# Patient Record
Sex: Female | Born: 1975 | Race: Black or African American | Hispanic: No | Marital: Single | Smoking: Current every day smoker
Health system: Southern US, Community
[De-identification: ages and names within clinical notes are randomized; demographics above are authoritative.]

## PROBLEM LIST (undated history)

## (undated) ENCOUNTER — Inpatient Hospital Stay (HOSPITAL_COMMUNITY): Payer: Self-pay

## (undated) DIAGNOSIS — E785 Hyperlipidemia, unspecified: Secondary | ICD-10-CM

## (undated) DIAGNOSIS — D219 Benign neoplasm of connective and other soft tissue, unspecified: Secondary | ICD-10-CM

## (undated) DIAGNOSIS — N730 Acute parametritis and pelvic cellulitis: Secondary | ICD-10-CM

## (undated) DIAGNOSIS — M6530 Trigger finger, unspecified finger: Secondary | ICD-10-CM

## (undated) DIAGNOSIS — D649 Anemia, unspecified: Secondary | ICD-10-CM

## (undated) DIAGNOSIS — R87629 Unspecified abnormal cytological findings in specimens from vagina: Secondary | ICD-10-CM

## (undated) DIAGNOSIS — B977 Papillomavirus as the cause of diseases classified elsewhere: Secondary | ICD-10-CM

## (undated) DIAGNOSIS — K219 Gastro-esophageal reflux disease without esophagitis: Secondary | ICD-10-CM

## (undated) DIAGNOSIS — F419 Anxiety disorder, unspecified: Secondary | ICD-10-CM

## (undated) DIAGNOSIS — F32A Depression, unspecified: Secondary | ICD-10-CM

## (undated) DIAGNOSIS — E119 Type 2 diabetes mellitus without complications: Secondary | ICD-10-CM

## (undated) HISTORY — PX: OTHER SURGICAL HISTORY: SHX169

## (undated) HISTORY — DX: Anxiety disorder, unspecified: F41.9

## (undated) HISTORY — DX: Gastro-esophageal reflux disease without esophagitis: K21.9

## (undated) HISTORY — PX: COLPOSCOPY W/ BIOPSY / CURETTAGE: SUR283

## (undated) HISTORY — PX: TUBAL LIGATION: SHX77

## (undated) HISTORY — DX: Depression, unspecified: F32.A

## (undated) HISTORY — DX: Anemia, unspecified: D64.9

## (undated) HISTORY — DX: Hyperlipidemia, unspecified: E78.5

## (undated) HISTORY — PX: DILATION AND CURETTAGE OF UTERUS: SHX78

## (undated) HISTORY — PX: ROTATOR CUFF REPAIR: SHX139

## (undated) SURGERY — Surgical Case
Anesthesia: *Unknown

---

## 1998-01-25 ENCOUNTER — Encounter: Admission: RE | Admit: 1998-01-25 | Discharge: 1998-01-25 | Payer: Self-pay | Admitting: Obstetrics & Gynecology

## 1998-04-29 ENCOUNTER — Emergency Department (HOSPITAL_COMMUNITY): Admission: EM | Admit: 1998-04-29 | Discharge: 1998-04-29 | Payer: Self-pay | Admitting: Emergency Medicine

## 1998-12-01 ENCOUNTER — Encounter: Admission: RE | Admit: 1998-12-01 | Discharge: 1998-12-01 | Payer: Self-pay | Admitting: Internal Medicine

## 1998-12-28 ENCOUNTER — Encounter: Admission: RE | Admit: 1998-12-28 | Discharge: 1998-12-28 | Payer: Self-pay | Admitting: Internal Medicine

## 1999-04-03 ENCOUNTER — Encounter: Admission: RE | Admit: 1999-04-03 | Discharge: 1999-04-03 | Payer: Self-pay | Admitting: Internal Medicine

## 1999-07-28 ENCOUNTER — Encounter: Admission: RE | Admit: 1999-07-28 | Discharge: 1999-07-28 | Payer: Self-pay | Admitting: Obstetrics & Gynecology

## 1999-08-14 ENCOUNTER — Encounter: Admission: RE | Admit: 1999-08-14 | Discharge: 1999-08-14 | Payer: Self-pay | Admitting: Internal Medicine

## 1999-08-14 ENCOUNTER — Ambulatory Visit (HOSPITAL_COMMUNITY): Admission: RE | Admit: 1999-08-14 | Discharge: 1999-08-14 | Payer: Self-pay | Admitting: Internal Medicine

## 1999-08-14 ENCOUNTER — Emergency Department (HOSPITAL_COMMUNITY): Admission: EM | Admit: 1999-08-14 | Discharge: 1999-08-14 | Payer: Self-pay | Admitting: Emergency Medicine

## 1999-08-16 ENCOUNTER — Encounter: Admission: RE | Admit: 1999-08-16 | Discharge: 1999-08-16 | Payer: Self-pay | Admitting: Internal Medicine

## 1999-08-21 ENCOUNTER — Encounter: Admission: RE | Admit: 1999-08-21 | Discharge: 1999-08-21 | Payer: Self-pay | Admitting: Internal Medicine

## 1999-10-16 ENCOUNTER — Other Ambulatory Visit: Admission: RE | Admit: 1999-10-16 | Discharge: 1999-10-16 | Payer: Self-pay | Admitting: Obstetrics and Gynecology

## 1999-10-19 ENCOUNTER — Encounter: Admission: RE | Admit: 1999-10-19 | Discharge: 2000-01-17 | Payer: Self-pay | Admitting: Obstetrics and Gynecology

## 1999-11-14 ENCOUNTER — Inpatient Hospital Stay (HOSPITAL_COMMUNITY): Admission: AD | Admit: 1999-11-14 | Discharge: 1999-11-14 | Payer: Self-pay | Admitting: Obstetrics & Gynecology

## 2000-01-03 ENCOUNTER — Encounter: Payer: Self-pay | Admitting: Obstetrics and Gynecology

## 2000-01-03 ENCOUNTER — Ambulatory Visit (HOSPITAL_COMMUNITY): Admission: RE | Admit: 2000-01-03 | Discharge: 2000-01-03 | Payer: Self-pay | Admitting: Obstetrics and Gynecology

## 2000-01-25 ENCOUNTER — Encounter: Payer: Self-pay | Admitting: Obstetrics and Gynecology

## 2000-01-25 ENCOUNTER — Ambulatory Visit (HOSPITAL_COMMUNITY): Admission: RE | Admit: 2000-01-25 | Discharge: 2000-01-25 | Payer: Self-pay | Admitting: Obstetrics and Gynecology

## 2000-03-04 ENCOUNTER — Inpatient Hospital Stay (HOSPITAL_COMMUNITY): Admission: AD | Admit: 2000-03-04 | Discharge: 2000-03-04 | Payer: Self-pay | Admitting: Obstetrics & Gynecology

## 2000-03-29 ENCOUNTER — Inpatient Hospital Stay (HOSPITAL_COMMUNITY): Admission: AD | Admit: 2000-03-29 | Discharge: 2000-03-29 | Payer: Self-pay | Admitting: Obstetrics and Gynecology

## 2000-04-09 ENCOUNTER — Encounter: Payer: Self-pay | Admitting: Obstetrics and Gynecology

## 2000-04-09 ENCOUNTER — Ambulatory Visit (HOSPITAL_COMMUNITY): Admission: RE | Admit: 2000-04-09 | Discharge: 2000-04-09 | Payer: Self-pay | Admitting: Obstetrics and Gynecology

## 2000-04-12 ENCOUNTER — Encounter (HOSPITAL_COMMUNITY): Admission: RE | Admit: 2000-04-12 | Discharge: 2000-04-25 | Payer: Self-pay | Admitting: Obstetrics and Gynecology

## 2000-04-24 ENCOUNTER — Inpatient Hospital Stay (HOSPITAL_COMMUNITY): Admission: AD | Admit: 2000-04-24 | Discharge: 2000-04-26 | Payer: Self-pay | Admitting: Obstetrics and Gynecology

## 2001-03-28 ENCOUNTER — Inpatient Hospital Stay (HOSPITAL_COMMUNITY): Admission: AD | Admit: 2001-03-28 | Discharge: 2001-03-28 | Payer: Self-pay | Admitting: Obstetrics & Gynecology

## 2001-03-28 ENCOUNTER — Encounter: Payer: Self-pay | Admitting: *Deleted

## 2001-03-31 ENCOUNTER — Inpatient Hospital Stay (HOSPITAL_COMMUNITY): Admission: AD | Admit: 2001-03-31 | Discharge: 2001-03-31 | Payer: Self-pay | Admitting: Obstetrics & Gynecology

## 2001-04-03 ENCOUNTER — Inpatient Hospital Stay (HOSPITAL_COMMUNITY): Admission: AD | Admit: 2001-04-03 | Discharge: 2001-04-03 | Payer: Self-pay | Admitting: Obstetrics & Gynecology

## 2001-04-08 ENCOUNTER — Inpatient Hospital Stay (HOSPITAL_COMMUNITY): Admission: AD | Admit: 2001-04-08 | Discharge: 2001-04-08 | Payer: Self-pay | Admitting: *Deleted

## 2001-04-10 ENCOUNTER — Inpatient Hospital Stay (HOSPITAL_COMMUNITY): Admission: AD | Admit: 2001-04-10 | Discharge: 2001-04-10 | Payer: Self-pay | Admitting: Obstetrics & Gynecology

## 2001-08-30 ENCOUNTER — Inpatient Hospital Stay (HOSPITAL_COMMUNITY): Admission: AD | Admit: 2001-08-30 | Discharge: 2001-08-30 | Payer: Self-pay | Admitting: Obstetrics & Gynecology

## 2001-09-02 ENCOUNTER — Inpatient Hospital Stay (HOSPITAL_COMMUNITY): Admission: AD | Admit: 2001-09-02 | Discharge: 2001-09-02 | Payer: Self-pay | Admitting: *Deleted

## 2001-09-11 ENCOUNTER — Inpatient Hospital Stay (HOSPITAL_COMMUNITY): Admission: AD | Admit: 2001-09-11 | Discharge: 2001-09-11 | Payer: Self-pay | Admitting: Obstetrics & Gynecology

## 2001-09-30 ENCOUNTER — Emergency Department (HOSPITAL_COMMUNITY): Admission: EM | Admit: 2001-09-30 | Discharge: 2001-09-30 | Payer: Self-pay | Admitting: Emergency Medicine

## 2003-07-05 ENCOUNTER — Emergency Department (HOSPITAL_COMMUNITY): Admission: EM | Admit: 2003-07-05 | Discharge: 2003-07-05 | Payer: Self-pay | Admitting: Emergency Medicine

## 2003-07-05 ENCOUNTER — Encounter: Payer: Self-pay | Admitting: Emergency Medicine

## 2004-02-27 ENCOUNTER — Emergency Department (HOSPITAL_COMMUNITY): Admission: EM | Admit: 2004-02-27 | Discharge: 2004-02-27 | Payer: Self-pay | Admitting: *Deleted

## 2005-09-30 ENCOUNTER — Inpatient Hospital Stay (HOSPITAL_COMMUNITY): Admission: AD | Admit: 2005-09-30 | Discharge: 2005-10-03 | Payer: Self-pay | Admitting: Obstetrics and Gynecology

## 2005-10-04 ENCOUNTER — Ambulatory Visit: Payer: Self-pay | Admitting: "Endocrinology

## 2005-10-08 ENCOUNTER — Encounter: Admission: RE | Admit: 2005-10-08 | Discharge: 2006-01-06 | Payer: Self-pay | Admitting: Obstetrics and Gynecology

## 2005-11-02 ENCOUNTER — Other Ambulatory Visit: Admission: RE | Admit: 2005-11-02 | Discharge: 2005-11-02 | Payer: Self-pay | Admitting: Obstetrics and Gynecology

## 2006-02-21 ENCOUNTER — Inpatient Hospital Stay (HOSPITAL_COMMUNITY): Admission: AD | Admit: 2006-02-21 | Discharge: 2006-02-21 | Payer: Self-pay | Admitting: Obstetrics and Gynecology

## 2006-03-01 ENCOUNTER — Ambulatory Visit (HOSPITAL_COMMUNITY): Admission: RE | Admit: 2006-03-01 | Discharge: 2006-03-01 | Payer: Self-pay | Admitting: Obstetrics and Gynecology

## 2006-03-22 ENCOUNTER — Other Ambulatory Visit: Admission: RE | Admit: 2006-03-22 | Discharge: 2006-03-22 | Payer: Self-pay | Admitting: Obstetrics and Gynecology

## 2006-04-28 ENCOUNTER — Inpatient Hospital Stay (HOSPITAL_COMMUNITY): Admission: AD | Admit: 2006-04-28 | Discharge: 2006-04-28 | Payer: Self-pay | Admitting: Obstetrics and Gynecology

## 2006-05-09 ENCOUNTER — Inpatient Hospital Stay (HOSPITAL_COMMUNITY): Admission: AD | Admit: 2006-05-09 | Discharge: 2006-05-09 | Payer: Self-pay | Admitting: Obstetrics and Gynecology

## 2006-05-11 ENCOUNTER — Inpatient Hospital Stay (HOSPITAL_COMMUNITY): Admission: RE | Admit: 2006-05-11 | Discharge: 2006-05-13 | Payer: Self-pay | Admitting: Obstetrics and Gynecology

## 2006-05-11 ENCOUNTER — Encounter (INDEPENDENT_AMBULATORY_CARE_PROVIDER_SITE_OTHER): Payer: Self-pay | Admitting: Specialist

## 2006-09-06 ENCOUNTER — Ambulatory Visit (HOSPITAL_COMMUNITY): Admission: RE | Admit: 2006-09-06 | Discharge: 2006-09-06 | Payer: Self-pay | Admitting: Obstetrics and Gynecology

## 2006-09-06 ENCOUNTER — Encounter (INDEPENDENT_AMBULATORY_CARE_PROVIDER_SITE_OTHER): Payer: Self-pay | Admitting: Specialist

## 2006-10-31 ENCOUNTER — Inpatient Hospital Stay (HOSPITAL_COMMUNITY): Admission: EM | Admit: 2006-10-31 | Discharge: 2006-11-05 | Payer: Self-pay | Admitting: Emergency Medicine

## 2007-02-03 ENCOUNTER — Emergency Department (HOSPITAL_COMMUNITY): Admission: EM | Admit: 2007-02-03 | Discharge: 2007-02-03 | Payer: Self-pay | Admitting: Emergency Medicine

## 2009-05-04 ENCOUNTER — Emergency Department (HOSPITAL_COMMUNITY): Admission: EM | Admit: 2009-05-04 | Discharge: 2009-05-04 | Payer: Self-pay | Admitting: Emergency Medicine

## 2010-12-23 LAB — GLUCOSE, CAPILLARY

## 2011-02-02 NOTE — H&P (Signed)
[facility]  Patient:    [name]                     MRN: [id number] Adm. Date:  [id number] Attending:  Leonard Schwartz                         History and Physical  (TIME DICTATED: [phone])  CHIEF COMPLAINT:                  Ms. [name] is a 35 year old female, gravida 3 para 1, 0-1-1, who presents at [redacted] weeks gestation (EDC is May 04, 2000) for induction of labor.  HISTORY OF PRESENT ILLNESS:       The patient has been followed at [facility] for this pregnancy that has been complicated by diabetes which required insulin therapy.  She is currently taking six units of Regular insulin and 14 units of NPH insulin in the mornings.  She is taking 19 units of Regular insulin at supper time.  She is taking 10 units of NPH insulin at bedtime.  Her sugars have been reasonably well controlled.  The patient has had nonstress tests which have been reactive.  Her most recent ultrasound was on April 09, 2000 and showed an appropriately grown infant with normal fluid values.  The infant was in cephalic presentation.                                    The patients rubella titer is negative. She did have a placenta previa that resolved.  She has a past history of a molar pregnancy.  PAST MEDICAL HISTORY:              1. Diabetes of pregnancy as mentioned                                       above.                                    2. Question of mitral valve prolapse.                                    3. Heart murmur since age 34.  ALLERGIES:                        No known drug allergies.  PAST OBSTETRICAL HISTORY:          1. In 1995 the patient delivered a 6 pound                                       3 ounce female infant at [redacted] weeks                                       gestation via vaginal delivery.  2. In 1998 the patient had a D&C because        of a molar pregnancy.                                    3. She has been noted to have diabetes                                       prior to her pregnancy and she was                                       treated with insulin with her prior                                       pregnancy.  She was treated with                                       Glucophage when not pregnant.  SOCIAL HISTORY:                   The patient is single and works as an Environmental health practitioner for [employer].  She denies cigarette use, alcohol use, and recreational drug use.  REVIEW OF SYSTEMS:                Normal pregnancy complaints.  FAMILY HISTORY:                   Multiple family members have diabetes.  PHYSICAL EXAMINATION:  VITAL SIGNS:                      Weight 197 pounds.  HEENT:                            Within normal limits.  CHEST:                            Clear.  HEART:                            Regular rate and rhythm.  BREAST:                           Without masses.  ABDOMEN:                          Gravid with fundal height of 43 cm.  EXTREMITIES:                      Within normal limits.  NEUROLOGIC:                       Grossly normal.  PELVIC:  Cervix 3 cm dilated, 50% effaced, -3 station.  LABORATORY DATA:                  Blood type is AB-positive.  Antibody screen negative.  Sickle cell negative.  VDRL nonreactive.  Rubella nonimmune.  HBsAg negative.  HIV negative.  GC and Chlamydia negative.  Varicella titer positive.  Alpha-fetoprotein within normal limits.  Third trimester beta strep positive.  ASSESSMENT:                       1. Patient with [redacted] week gestation.                                   2. Insulin requiring diabetes.                                   3. Positive beta streptococcus.  PLAN:                             The patient will be admitted for induction of labor.  We will start the  patient on Pitocin and then rupture her membranes.  She will receive antibiotics for beta strep prophylaxis.  She had an abnormal Pap smear during her pregnancy and this will be fully evaluated postpartum.  She will receive antibiotics for her beta strep colonization. DD:  04/21/00 TD:  04/22/00 Job: [id number]

## 2011-02-02 NOTE — Discharge Summary (Signed)
[name], [name]           ACCOUNT NO.:  [id number]   MEDICAL RECORD NO.:  [id number]          PATIENT TYPE:  INP   LOCATION:  3244                         FACILITY:  [facility]   PHYSICIAN:  Sheila Oats, M.D.DATE OF BIRTH:  06-09-1976   DATE OF ADMISSION:  10/30/2006  DATE OF DISCHARGE:  11/05/2006                               DISCHARGE SUMMARY   DISCHARGE DIAGNOSES:  1. Erythema multiforme.  2. Normal liver function tests, unclear etiology, workup negative.  3. Left axillary abscess, status post drainage at Urgent Care.  4. Diabetes mellitus.  5. Leukopenia.   PROCEDURES AND STUDIES:  1. Skin biopsy.  Per dermatology, interface dermatitis with features      of erythema multiforme.  2. Gallbladder ultrasound.  Sludge in the gallbladder without      gallstones.  No acute abnormality.   HISTORY:  The patient is a 35 year old black female with the above-  listed medical problems, who presented with a history of fever  associated with nausea and vomiting, muscle aches and sore throat.  She  had had a left axillary abscess that was drained at Urgent Care and she  was placed on Bactrim.  She came to the ED with the above complaint.  No  history of cough, sputum production, chest pain, shortness of breath,  dysuria.  In the ER the patient was noted to be hyponatremia, a sodium  of 128, and also leukopenic, white cell count of 3.1.  She was admitted  to the [facility] service for further evaluation and management.   PHYSICAL EXAMINATION:  VITAL SIGNS:  Her physical exam upon admission as  per Dr. Vista Lawman revealed a blood pressure of 106/68, pulse of 100,  initially 112, respiratory rate of 18, temperature 98.9.   Pertinent findings on exam:  GENERAL:  Patient not toxic-appearing.  SKIN:  She had an erythematous rash/erythroderma on her legs, upper  extremities as well, and on her face she had hyperpigmented, mottled  patches laterally on her cheeks.  The soles  of her feet and the palms of  her hands were clear.  No vesicles and no bullae.  A drained abscess  with no discharge.  The rest of her physical exam was reported to be within normal limits.   LABORATORY DATA:  Sodium 128, potassium 3.8, chloride of 95, CO2 of 21,  glucose 202, BUN 9, creatinine 0.7, calcium 7.6, total protein 7.4,  albumin 3.1, AST 588, alkaline phosphatase 941, bilirubin 1.8.  White  cell count 3.1.  The rest of her CBC within normal limits.   HOSPITAL COURSE:  Problem 1.  LEFT AXILLARY ABSCESS:  As discussed above, upon admission  it was noted that the patient had had the abscess drained at an urgent  care and was started on Bactrim, which she states she had been taking  for 9 days prior to admission.  In the ER the packing was taken out.  No  drainage/signs of infection.  The patient had blood cultures done and  was empirically placed on IV vancomycin.  Blood cultures were negative  x4 days.  The patient remained afebrile during  her hospital stay.  She  has been on a total of 14 days of antibiotics including the Bactrim she  took as an outpatient and especially given the adverse reaction that she  had with the Bactrim, will not give any further antibiotics at this  time.  She is to follow up with her dermatologist as well as her primary  care physician for further management of her suppurative hidradenitis.  She is to continue local care as instructed of the left axillary area.   Problem 2.  ERYTHEMA MULTIFORME:  The patient while in the hospital  indicated that she had developed the rash after she started taking the  Bactrim and came to the ER initially because of the rash.  The patient  also reported that she had been on Tamiflu as well.  The Bactrim and  Tamiflu were discontinued.  Dermatology was consulted and Dr. Delman Cheadle saw  the patient and a biopsy was done, and it was consistent with erythema  multiforme as above.  The patient was closely monitored for any  further  complications/development of Stevens-Johnson.  The rash improved with  complete resolution of the erythroderma.  She had some mild superficial  desquamation of the facial areas but this resolved after the patient was  prescribed the Aquaphor Cream per dermatology.  The patient did not ever  develop any oral mucosal lesions.  Ophthalmology was consulted because  the patient was noted to have eyelid edema as well as conjunctival  erythema.  Dr. Nehemiah Massed saw the patient and noted that she had some mild  bulbar chemosis temporally.  The palpebral conjunctivae of the upper lid  noted to have scattered fibrotic lesions with epithelial defects.  Also  the palpebral conjunctivae of the lower eyelids were noted to have  fibrotic lesions running for the full length of the conjunctivae but  only 2.5-3 mm in width.  Following this, Dr. Nehemiah Massed recommended  artificial tears as well as a GenTeal gel b.i.d.  He recommended that  the patient follow up [place] within one week to recheck for symblepharon.  He noted on the patient's exam that there was no symblepharon.  I  discussed the patient with Dr. Delman Cheadle, and she agrees regarding discharge  at this time.  She will follow up with her on Friday, February 22, at 8  a.m.  The patient is also to follow up with her primary care physician.   Problem 3.  ELEVATED LIVER FUNCTION TESTS:  As discussed above, the  patient's LFTs were noted to be elevated upon admission.  She did not  have any abdominal pain.  While in the hospital tests were repeated and  they had improved.  Her last LFTs prior to discharge showed an alkaline  phosphatase of 120, SGOT of 36, SGPT of 126, total protein 4.6, albumin  of 2.2.  She had a hepatitis panel done, which was negative, and also  she had a right upper quadrant ultrasound to further evaluate with the  results as stated above, no acute findings.  The etiology of her abnormal LFTs is unclear.  The patient is to follow up  with her primary  care physician for a recheck upon monitoring of the LFTs.   Problem 4.        QUESTION INFLUENZA:  The patient reported that she had  had a flu nasal swab done at the urgent care that was negative but a  blood test had been done, which the patient reported was positive and  she was placed on Tamiflu.  When she presented to the hospital with the  rash, the Tamiflu was also discontinued as it is also a possible cause  of the erythema multiforme.  The patient has had no myalgias,  arthralgias or further fevers during her hospital stay.  She had  influenza titers ordered while she was in the hospital and at the time  of this dictation, the results are still pending.   Problem 5.  DIABETES MELLITUS:  The patient's Accu-Checks were monitored  and she was covered with sliding scale insulin during her hospital stay.  She is to continue her preadmission medications upon discharge.   Problem 6.  HYPOKALEMIA:  The patient's potassium was replaced in the  hospital.   Problem 7.  HISTORY OF SUPPURATIVE HIDRADENITIS:  The patient is to  follow up with her dermatologist.   DISCHARGE MEDICATIONS:  1. Artificial tears q.i.d. p.r.n.  2. GenTeal gel b.i.d.  3. Mupirocin 2% ointment t.i.d.   FOLLOW-UP CARE:  1. Ophthalmologist, Dr. Nehemiah Massed, in one week.  Patient to call [phone] for appointment.  2. Dr. Delman Cheadle on Friday, February 22 at 8 a.m.  3. Follow up with primary care physician in 1-2 weeks and LFTs to be      rechecked upon follow-up.   DISCHARGE CONDITION:  Improved/stable.      Sheila Oats, M.D.  Electronically Signed     ACV/MEDQ  D:  11/05/2006  T:  11/05/2006  Job:  [id number]   cc:   Jari Pigg, M.D.   Harrison Mons, P.A.  Phone [phone]  FAX [phone]   Jacelyn Pi, M.D.  Fax: [phone]

## 2011-02-02 NOTE — H&P (Signed)
[name], [name] NO.:  [id number]   MEDICAL RECORD NO.:  [id number]          PATIENT TYPE:  INP   LOCATION:  9174                          FACILITY:  [facility]   PHYSICIAN:  Crist Fat. Rivard, M.D. DATE OF BIRTH:  06/24/1976   DATE OF ADMISSION:  05/11/2006  DATE OF DISCHARGE:                                HISTORY & PHYSICAL   Ms. Papaleo is a 35 year old, married, black female, gravida 7, para 0-2-4-  2 who presents at 37-3/[redacted] weeks gestation having regular contractions.  She  denies leaking, denies bleeding, reports positive fetal movement and is not  having any signs and symptoms of PIH.  Her pregnancy has been followed by  the [facility] service and has been remarkable for:  1. Rubella equivocal.  2. Greater than 2 ABs and 1 ectopic.  3. Preterm delivery at 34 weeks.  4. Insulin dependent diabetic requiring a pump.  5. Hidradenitis.  6. Increased body mass index.  7. Previous smoker.   Her prenatal labs were collected on September 28, 2005. Hemoglobin 13.7,  hematocrit 34.9, platelets 190,000.  Blood type AB positive, antibody  negative, sickle cell trait negative, rubella equivocal.  Hepatitis B  surface antigen negative. HIV nonreactive.  Cystic fibrosis negative.  Hemoglobin A1c elevated at 12.1.  Varicella immune. Pap smear from November 02, 2005 shows ASCUS with positive HPV. Gonorrhea and chlamydia were  negative.   HISTORY OF PRESENT PREGNANCY:  The patient presented for care at [facility] on September 28, 2005. She was at approximately 5 weeks and 3 days at  that visit. The patient is an insulin-dependent diabetic and has been  monitoring her blood sugars since the beginning of the pregnancy. Ultrasound  on October 12, 2005 showed intrauterine pregnancy at 7 weeks and 4 days  consistent with an [facility] of May 29, 2006. At that point, the patient was  using NPH in the morning, 12 units in the morning and insulin in the  evening  and was also requiring a sliding scale.  She was seeing her endocrinologist  and was considering getting a pump.  The endocrinologist was Dr. Fransico Michael. She was also having a nutrition  evaluation. She did receive an insulin pump on February 11. Her new OB visit  was on February 16. By 16 weeks' gestation, her fasting blood sugars were  mostly in the normal range but 2 hour postprandial were elevated. The  patient had a colposcopy at [redacted] weeks gestation due to ASCUS on her Pap  smear, ultrasonography on April 25 with her anatomy scan. The patient was  referred to a perinatologist after that visit due to a short nasal bone. The  patient's 24-hour urine in the first trimester showed 8 mg of protein with  normal creatinine. Amniocentesis was performed by Dr. Particia Nearing showing  normal female chromosomes. The patient had a negative fetal fibronectin at  26 weeks' gestation.  She was seen for contractions at that visit.  Ultrasonography on June 16 showed estimated fetal weight at the 75th to 90th  percentile with normal fluid and breech presentation.  The  cervix was 3.9  cm. The patient started antenatal testing at 32 weeks consisting of biweekly  NSTs. The patient's Pap smear at 30 weeks showed LGSIL. The plan was for  that to be repeated postpartum. Her blood sugars remained in a good range.  She had a normal AFI at 32 weeks' gestation. At 34 weeks,  estimated fetal  weight was at the 89th percentile with normal fluid. Abdominal circumference  greater than the 90th percentile with BPP 8/8. The rest of her prenatal care  has been unremarkable.   OB HISTORY:  She is a gravida 7, para 0-2-4-2. In December 1995, she had a  vaginal delivery of a female infant weighing 6 pounds and 3 ounces at [redacted] weeks  gestation after 48 hours in labor.  She had an epidural for anesthesia.  The  infant's name was Ivin Booty. In 2000, she had a molar pregnancy at 6-[redacted] weeks  gestation. In August 2001, she had a  vaginal delivery of a female infant  weighing 8 pounds 9 ounces at [redacted] weeks gestation with an epidural for  anesthesia.  The patient was induced. The infant's name was Colin Mulders and  infant had brachial plexus. In December 2001 at [redacted] weeks gestation, she had  an ectopic pregnancy and used methotrexate for that. In July 2002 at 4  weeks, she had an SAB. In February 2005 at 4 weeks, she had an elective AB  and this seventh pregnancy is the current pregnancy.  She reports with all  pregnancies she had anemia.   MEDICAL HISTORY:  She has no medication allergies.  She experienced menarche  at the age of 50 with 31-day cycles lasting 4-5 days. She has a history of  abnormal Pap smears and has had multiple colposcopy use. GYN surgeries  consist of a D&C and her elective AB. She had Chlamydia in 89, gonorrhea at  age 54, also was diagnosed with HPV at age 50.  She has frequent and  recurring bacterial vaginosis. The patient was diagnosed with diabetes at  age 75 and at that point it was diet controlled.  She has been on  medications for diabetes since age 10.  She has an occasional episode of  cystitis.  She also has hidradenitis. The patient has been a smoker since  age 85 and has typically been smoking at the rate of half a pack per day.  She stopped smoking altogether in January 2007.   SURGERIES:  In addition to the [facility] and elective AB include wisdom teeth  extraction in '99 and a general tube extraction in '07. family.   FAMILY MEDICAL HISTORY:  Father is deceased from heart disease, multiple  family members with chronic hypertension.  Maternal grandmother with  varicosities, sister with asthma, multiple family members insulin dependent  diabetics, paternal grandmother non-insulin dependent diabetic.  Father has  received dialysis, also had a stroke. Maternal aunt is deceased from breast cancer. Multiple family members alcohol users and smokers.   GENETIC HISTORY:  Remarkable for patient's  cousin with cleft lip. Father of  the baby's cousin is a hermaphrodite. Father of baby's parents are related.  They are an uncle and a niece.  The patient's cousin with mental  retardation.  The patient's aunt had twins.   SOCIAL HISTORY:  The patient is married to the father of the baby.  He is  involved and supportive. His name is [place]. They are of the [facility] faith.  The patient has 13 years of education and  is employed full-time in [employer].  Father of baby has 12+ years of education and is self-employed.  They deny any alcohol, tobacco or illicit drug use since the positive  pregnancy test in January, 2007.   OBJECTIVE:  VITAL SIGNS:  Stable.  She is afebrile.  HEENT:  Grossly within normal limits.  CHEST:  Clear to auscultation.  HEART:  Regular rate and rhythm.  ABDOMEN:  Gravid in contour with fundal height extending approximately 37 cm  above the pubic symphysis.  Fetal heart rate is reassuring in the 140s.  Contractions are every 2-3 minutes.  CERVICAL EXAM:  Per R.N. is 7 cm.  EXTREMITIES:  Within normal limits.   ASSESSMENT:  1. Intrauterine pregnancy at term.  2. Insulin dependent diabetic with a pump.  3. Active labor.   PLAN:  1. Admit to birthing suite. Dr. Estanislado Pandy is aware.  2. Routine MD orders.  3. Anticipate spontaneous vaginal birth      Cam Hai, C.N.M.      Crist Fat Rivard, M.D.  Electronically Signed    KS/MEDQ  D:  05/11/2006  T:  05/11/2006  Job:  [id number]

## 2011-02-02 NOTE — H&P (Signed)
[name], [name] NO.:  [id number]   MEDICAL RECORD NO.:  [id number]          PATIENT TYPE:  INP   LOCATION:  0101                         FACILITY:  [facility]   PHYSICIAN:  Jackie Plum, M.D.DATE OF BIRTH:  12-13-1975   DATE OF ADMISSION:  10/30/2006  DATE OF DISCHARGE:                              HISTORY & PHYSICAL   CHIEF COMPLAINT:  Fever.   HISTORY OF PRESENT ILLNESS:  The patient is a 35 year old African-  American lady who presented with several days' history of fever  associated with nausea, vomiting, muscle aches, sore throat.  She had  had a left axillary abscess, status post drainage at an urgent care and  was started on antibiotics, Bactrim.  However, she came to the ED  because of the above complaints.  No history of cough, sputum  production, chest pain, shortness of breath, dysuria or frequency of  micturition.  At the emergency room the patient was noted to have  hyponatremia with leukopenia.  She was noted to be tachycardic and was  admitted for further evaluation and management of her fever.   PAST MEDICAL HISTORY:  Positive for diabetes mellitus.   FAMILY HISTORY:  Positive for diabetes.   SOCIAL HISTORY:  The patient is a Clinical biochemist person.  Smokes  cigarettes.  Does not drink alcohol.   ALLERGIES:  No known allergies.   She is on metformin for her diabetes mellitus.   REVIEW OF SYSTEMS:  As noted above, otherwise unremarkable.   PHYSICAL EXAMINATION:  VITAL SIGNS:  BP 106/68, pulse 100, pulse was up  to 112 on presentation, respirations 18, temperature 98.9 degrees  Fahrenheit.  GENERAL:  She was not acutely ill-looking.  Not in distress.  HEENT:  Normocephalic, atraumatic.  Pupils equal, round, reactive to  light.  Extraocular movements intact.  Oropharynx moist.  Sclerae pallor  present.  No icterus.  NECK:  Supple, no JVD.  LUNGS:  Clear to auscultation.  CARDIAC:  Regular rate and rhythm.  No gallops or murmur.  ABDOMEN:  Obese, soft, nontender, bowel sounds present.  EXTREMITIES:  No cyanosis.  SKIN:  The patient has a drained abscess in the left axillary area.  There was no obvious discharge noted.   LABS REVIEWED:  Chemistry indicated a sodium of 128, potassium 3.8,  chloride 95, CO2 21, glucose 202, BUN 9, creatinine 0.7, calcium 7.6,  total protein 7.4, albumin 3.1, AST 588, alkaline phosphatase 941,  bilirubin 1.8.  WBC count 3.1, otherwise CBC essentially within normal  limits.   IMPRESSION:  1. Fever.  2. Transaminitis of unclear etiology.  3. Leukopenia.  4. Diabetes mellitus.   The patient is admitted for IV antibiotics, cultures of urine and blood.  We will get repeat LFTs and if persistent, would initiate a workup for  her abnormal liver function testing.  We will follow leukopenia, which  could be related to the infectious process.  Will follow up on cultures,  and further recommendations will be made as the data base expands.  The  patient is diabetic and will be instituted on sliding scale insulin.  Will follow her CBGs.  Jackie Plum, M.D.  Electronically Signed     GO/MEDQ  D:  10/31/2006  T:  10/31/2006  Job:  [id number]

## 2011-02-02 NOTE — Op Note (Signed)
[name], [name] NO.:  [id number]   MEDICAL RECORD NO.:  [id number]          PATIENT TYPE:  INP   LOCATION:  9117                          [facility]ILITY[facility]   PHYSICIAN:  Crist Fat. Rivard, M.D. DATE OF BIRTH:  [birth year]   DATE OF PROCEDURE:  05/11/2006  DATE OF DISCHARGE:                                 OPERATIVE REPORT   PREOPERATIVE DIAGNOSIS:  Desire for sterilization.   POSTOPERATIVE DIAGNOSIS:  Desire for sterilization.   ANESTHESIA:  General, Dr. Jean Rosenthal.   PROCEDURE:  Postpartum bilateral tubal ligation.   SURGEON:  Dr. Estanislado Pandy.   ESTIMATED BLOOD LOSS:  Minimal.   DESCRIPTION OF PROCEDURE:  After being informed of the planned procedure  with possible complications, including bleeding, infection, injury to other  organs, irreversibility, and failure rate of one in 1000, informed consent  is obtained.  The patient is taken to OR #4 and given general anesthesia  with endotracheal intubation without any complication.   She is prepped and draped in a sterile fashion.  Umbilical area is then  infiltrated with 10 cubic centimeters of Marcaine 0.25% and we perform a  semi-elliptical incision which is brought down sharply to the fascia.  The  fascia is identified, grasped with Kocher forceps and incised, and the  peritoneum is entered bluntly.  We first identify the left tube which was  grasped with Babcock forceps and exteriorized until fimbriae is visualized.  We then enter the mesosalpinx with cautery, doubly ligate the proximal  stump, doubly ligate the distal stump with 0-chromic, remove a portion of  1.5 cm of the distal ampullary area, and cauterize the stump.  Hemostasis is  adequate.  We proceed in an identical fashion for right tube, again after  exteriorizing the tube and visualizing the fimbriae.  Hemostasis again is  adequate.  Fascia is then closed with running suture of 0-Vicryl and skin is  closed with subcuticular suture of 4-0 Monocryl  and Steri-Strips.   Instruments and sponge count is complete x2.  Estimated blood loss is  minimal.  The procedure is very well-tolerated by the patient who is taken  to recovery room in a well and stable condition.  The specimen is sent to  pathology.      Crist Fat Rivard, M.D.  Electronically Signed     SAR/MEDQ  D:  05/11/2006  T:  05/13/2006  Job:  [id number]

## 2011-02-02 NOTE — Op Note (Signed)
NAMELEXII, WALSH NO.:  [id number]   MEDICAL RECORD NO.:  [id number]          PATIENT TYPE:  AMB   LOCATION:  SDC                           [facility]   PHYSICIAN:  Dois Davenport A. Rivard, M.D. DATE OF BIRTH:  11-04-1975   DATE OF PROCEDURE:  09/06/2006  DATE OF DISCHARGE:                               OPERATIVE REPORT   PREOPERATIVE DIAGNOSIS:  Moderate cervical dysplasia or CIN-2.   POSTOPERATIVE DIAGNOSIS:  Moderate cervical dysplasia or CIN-2.   ANESTHESIA:  Local, Dr. Estanislado Pandy.   PROCEDURE:  Loop electrosurgical excision procedure.   SURGEON:  Crist Fat. Rivard, M.D.   ESTIMATED BLOOD LOSS:  Minimal.   PROCEDURE:  After being informed of the planned procedure with possible  complications including failure of treatment, cervical stenosis and  cervical incompetence, informed consent is obtained.  The patient is  taken to OR #8 and placed in the lithotomy position.  She is draped in a  sterile fashion and a speculum is inserted.  Her cervix is prepped with  acetic acid and colposcopy reveals a predominantly anterior lesion,  acetowhite with mosaic, also a small posterior lesion as previously  described in colposcopy.  We proceed with paracervical block using  lidocaine 1%, epinephrine 1:200,000, twenty-two milliliters, in the  usual fashion.  Using a large loupe, we perform a LEEP in 2 passes,  isolating anterior and posterior lip; these specimens were sent to  Pathology, after identification of the 12 o'clock, 3 o'clock, 6 o'clock  and 9 o'clock margins.   We then cauterize the excision site until satisfactory hemostasis and  Monsel is applied.  Speculum is removed.  instruments and sponge count  is complete x2.  The procedure is very well tolerated by the patient,  who is taken to recovery room for discharge home in a well and stable  condition.      Crist Fat Rivard, M.D.  Electronically Signed     SAR/MEDQ  D:  09/06/2006  T:  09/06/2006   Job:  [id number]

## 2011-02-02 NOTE — Discharge Summary (Signed)
[name], [name] NO.:  [id number]   MEDICAL RECORD NO.:  [id number]          PATIENT TYPE:  INP   LOCATION:  9117                          [facility]   PHYSICIAN:  Crist Fat. Rivard, M.D. DATE OF BIRTH:  1976-07-13   DATE OF ADMISSION:  05/11/2006  DATE OF DISCHARGE:  05/13/2006                                 DISCHARGE SUMMARY   ADMITTING DIAGNOSIS:  Intrauterine pregnancy at term, active labor, insulin  dependent diabetes, desires sterilization.   PROCEDURE:  Normal spontaneous vaginal delivery.  Postpartum BTL.   DISCHARGE DIAGNOSIS:  Intrauterine pregnancy at term delivered normal  spontaneous vaginal delivery.  Postpartum BTL, diabetes and asymptomatic  anemia.  [name] is a 35 year old gravida 7, para 0-2-4-2 who presents at 73-  3/7 weeks in active labor.  She progressed rapidly and went on to a normal  spontaneous vaginal delivery with the birth of an 8 pounds 3 ounces female  infant named Brittney with Apgar scores of 8 at one minute 9 at five  minutes.  The patient underwent postpartum BTL on the same evening and did  well in the postpartum and postoperative period.  Her preop hemoglobin was  10.2 postop 7.5.  The patient has remained asymptomatic in anemia.  She is  out of bed without difficulty.  No complaints of dizziness or syncope,  orthostatic vital signs were within the normal range.  Her diabetes is  managed by insulin pump which she has been managing and changing according  to her blood sugar readings with support from Dr. Talmage Nap.  The patient is to  call [facility] to find out how long she can continue using insulin pump and  will make a follow-up appointment with Dr. Talmage Nap for glucose/insulin  management.  On this her second postpartum postoperative day she is judged  to be in satisfactory condition for discharge.  Her vital signs have  remained stable.  She is afebrile.  Her abdomen is soft with positive bowel  sounds noted.  The  umbilical incision is clean, dry and intact her discharge  instructions will be per [facility] handout.   DISCHARGE MEDICATIONS:  Motrin 600 mg p.o. q.6 h p.r.n. pain, prenatal  vitamin one p.o. q.d., iron supplement b.i.d. The patient will contact Dr.  Talmage Nap for diabetes management and follow-up and follow-up at the office of  [facility] in 6 weeks or p.r.n.      Rica Koyanagi, C.N.M.      Crist Fat Rivard, M.D.  Electronically Signed    SDM/MEDQ  D:  05/13/2006  T:  05/13/2006  Job:  [id number]

## 2011-02-02 NOTE — Consult Note (Signed)
NAMEMILANNI, Suzanne NO.:  [id number]   MEDICAL RECORD NO.:  [id number]          PATIENT TYPE:  INP   LOCATION:  9304                          [facility]   PHYSICIAN:  [name], M.D.DATE OF BIRTH:  Jul 20, 1976   DATE OF CONSULTATION:  10/03/2005  DATE OF DISCHARGE:                                   CONSULTATION   CHIEF COMPLAINT:  Referral from Dr. Osborn Juarez on the [facility] service for  evaluation and management of diabetes in the setting of intrauterine  pregnancy.   HISTORY OF PRESENT ILLNESS:  Ms. [name] is a 35 year old African-American  woman.  The history was obtained from the patient and from the inpatient  medical record.  The patient is currently a gravida 7, para 2 woman.  She  had two successful term pregnancies, the first with a birth weight of 6  pounds 8 ounces and the second with a birth weight of 8 pounds 12 ounces.  During the second pregnancy she was on insulin.  She has also had one molar  pregnancy and three spontaneous abortions.  She is now at approximately [redacted]  weeks gestation.  Her last menstrual period was August 20, 2005.  The  patient was admitted to the [facility] service on September 30, 2005 for uncontrolled  diabetes.  In the recent weeks all of her sugars have been above 300.  She  was documented to have blood sugars between 318 and 354.  Upon admission she  was placed on a Glucommander protocol and given IV insulin per that  protocol.  She was also noted to have some hydradenitis and was placed on  ampicillin 500 mg twice a day.  The Glucommander was subsequently  discontinued and the patient was put on a four injection regimen of insulin.  In the morning she has been taking 9 units of NPH and at bedtime she has  been taking 9 units of NPH.  At meal time she has been taking NovoLog 11  units.  She has also been taking Regular insulin by sliding scale with 2  units for every 20 points of blood sugar above 120.  In addition,  she has  been having post prandial checks done at the two-hour mark and also taking  extra Regular insulin at that time by the same sliding scale.  Patient was  diagnosed with diabetes at age 37.  She states that she was overweight, but  just cannot remember how heavy she was at that time.  She says that her  usual weight is about 160 and she is about 5 feet 2 inches tall.  The  patient's last diabetes education was at about age 89.  She has had no  continuing education since then.  She recognized the term hemoglobin A1c,  but does not know what her own value is.   MEDICATIONS:  Avandamet 2/500 a pill twice a day.   PAST MEDICAL HISTORY:  1.  Medical:  Obesity and type 2 diabetes mellitus.  2.  Surgical:  None.  3.  Allergies:  No known drug allergies.   SOCIAL HISTORY:  The patient is married.  She and her husband have two  children.  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  She does not use tobacco, alcohol, or illicit drugs.  She  previously received primary care through the [facility] program.  She has  now started to receive her care at [facility] [place].   FAMILY HISTORY:  There is diabetes in both of the patient's parents, two of  her sisters, and in multiple aunts and uncles on her father's side.  There  is no known family history of thyroid disease, atherosclerotic heart  disease, or strokes.  She has one maternal aunt with breast cancer.   REVIEW OF SYSTEMS:  The patient feels well.  She has no recognized problems.  She is not aware of any kidney disease or eye disease.   PHYSICAL EXAMINATION:  VITAL SIGNS:  Temperature 98, heart rate 90, blood  pressure 94/70, weight 161.2 pounds.  Capillary blood glucose on January 16  two hours post breakfast were 166, two hour post lunch 231, and two hours  post supper 241.  Fasting blood glucose this morning on January 17 was 201.  GENERAL:  The patient is alert and oriented to person,  place, and time.  She  states that she is willing to try to take better care of her blood sugar.  She is in no acute distress.  HEENT:  Eyes:  There was no arcus and no proptosis.  Mouth:  She has normal  oropharynx and normal oral moisture.  NECK:  She has no bruits.  Her thyroid gland is full at about 25 g.  Her  thyroid gland is nontender.  LUNGS:  Clear.  She moves air well.  HEART:  S1, S2 are normal.  ABDOMEN:  The abdomen is soft and nontender.  EXTREMITIES:  Hand:  She has no tremor.  She has normal palms.  Legs:  There  is no edema present.  NEUROLOGIC:  She has 5+ strength in upper and lower extremities.  Sensation  and touch is intact in her feet.   ASSESSMENT:  1.  Diabetes mellitus.  It appears that the patient has type 2 diabetes.      She has had no diabetes education since age 86.  She has no idea what      her own personal hemoglobin A1c is.  Although she did take insulin      during her second pregnancy she cannot remember what regimen she was on      or how well it worked.  She has recently transferred her care to [facility] [place].  She states that her blood sugars have mostly      been in the 300s on her current regimen Avandamet twice a day.  The      patient needs to be on a plan that will match insulin to her CBG values      and carbohydrate intake.  Jovanovic plan requires a lot of adjustment by      nurse practitioners or obstetricians or other M.D.s of her insulin      regimen and requires fairly rigid carbohydrate intake in meals.  I      believe that a better plan is to teach the patient how to count      carbohydrates, then have her take her NovoLog by what we call the two-      component method, that is,  she takes a certain amount of insulin for      correction dose based on her CBG value and she takes a certain amount of      insulin for food dose based upon what her actual carbohydrate count is.     The use of R insulin in this case on  top of the NPH and NovoLog as a      third type of insulin with pharmacokinetics.  This is much more likely      to cause variable blood sugars and hypoglycemia during the process.  I      would therefore recommend that if she is going to take additional      insulin at the postprandial periods that it be NovoLog rather than      Regular.  I would also recommend as part of that we use for postprandial      timing time about two and a half hours after a meal or roughly half-way      between breakfast and lunch and lunch and supper.  This will allow the      insulin taken at the meal to have pretty much its full effect before she      is given additional insulin.  2.  The patient has a goiter.  She needs to have thyroid function tests      performed.  3.  Obesity.  For both her obesity and diabetes she should seek [facility] and should receive follow-up care with      them.  4.  Intrauterine pregnancy.  The patient is currently [redacted] weeks gestation.  It      is very important for her to get good blood sugar control.  I know the      obstetrics staff has stressed this to her.  I have stressed it as well.   RECOMMENDATIONS:  1.  Increase her morning NPH to 10 units and her bedtime NPH to 11 units.  2.  Discontinue her Regular insulin.  3.  At meals give NovoLog aspart by sliding scale until she learns the two-      component method.  According to this sliding scale if her blood sugar is      less than 120 she will receive 11 units, blood sugar 121-140 12 units,      blood sugar 141-160 13 units, blood sugar 161-180 14 units, blood sugar      181-200 15 units, and for a blood sugar greater than 200 she will      receive 1 additional of NovoLog for every 20 points of blood sugar      greater than 200.  4.  During the daytime postprandial checks would recommend that she have her      blood sugar checked two and a half to three hours after the meals so       roughly half-way between breakfast and lunch, half-way between lunch and      supper.  If her blood glucose is less than 120 would give her no      additional NovoLog.  After that she would receive 1 unit for every 20      points above 120 between 121-200.  Above 200 she would receive 1      additional unit of NovoLog for every 20 points of blood sugar greater      than 200.  At bedtime I would also  give her a NovoLog by additional      sliding scale but I would start the sliding scale at 1 unit for every 20      points above 160 up to 260 and then 1 unit for every 40 points of blood      sugar above that.  5.  Would recommend referring [facility] to [facility] as soon as possible.  6.  I would recommend contacting Dr. Dorisann Frames and request that she see      the patient on an outpatient basis.  This will keep her primary care and      her endocrinology care within the [facility] system.  If Dr. Talmage Nap declines     to see the patient, however, then I will find a way to see her at      Pediatric __________ .  7.  Recommend drawing TSH and free T4 now.           ______________________________  [name], M.D.     MJB/MEDQ  D:  10/03/2005  T:  10/03/2005  Job:  [id number]   cc:   Dorisann Frames, M.D.  Fax: [phone]   [name], M.D.  Fax: [phone]

## 2011-02-02 NOTE — H&P (Signed)
[name], [name] NO.:  [id number]   MEDICAL RECORD NO.:  [id number]          PATIENT TYPE:  INP   LOCATION:  9304                          [facility]   PHYSICIAN:  Crist Fat. Rivard, M.D. DATE OF BIRTH:  Jan 19, 1976   DATE OF ADMISSION:  09/30/2005  DATE OF DISCHARGE:                                HISTORY & PHYSICAL   [name] is a 35 year old gravida 7 para 2-0-4-2 who presents at  approximately [redacted] weeks gestation; LMP August 20, 2005. She presents for  management of uncontrolled diabetes with pregnancy. Her blood sugar last  p.m. was 354 and the patient was instructed to come for admission to [facility] this morning prior to her fasting blood sugar. She does not report  any problems with her pregnancy. No cramping, abdominal pain or vaginal  bleeding. 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 for blood sugar control. Her previous care  for diabetes management was with [facility] and she is currently receiving  care from [facility] [place].   OBSTETRICAL HISTORY:  The patient had two vaginal deliveries at term. In  1995 she had a normal spontaneous vaginal delivery with the birth of a 6-  pound 8-ounce female infant named Ivin Booty with no complications. In 2001 the  patient had a normal spontaneous vaginal delivery at term with the birth of  an 8-pound 12-ounce female infant named Colin Mulders with no complications. In  1998 the patient had a molar pregnancy. In 2001, 2002 and 2005 the patient  experienced a first trimester SAB with no complications. This is her seventh  and current pregnancy.   MEDICAL HISTORY:  Significant for juvenile-onset diabetes and is otherwise  unremarkable.   FAMILY HISTORY:  The patient's mother and father both with a history of  diabetes. The patient's father with heart disease.   GENETIC HISTORY:  There is no family history of familial or  chromosomal  disorders, children that died in infancy or that were born with birth  defects.   The patient has no known drug allergies. She denies the use of tobacco,  alcohol or illicit drugs. She does take Avandamet p.o. for her diabetes.   SOCIAL HISTORY:  [name] is a married African-American female. Her  husband, Neytiri Asche, is involved and supportive. They are [place]  in their faith.   REVIEW OF SYSTEMS:  Is as described above. The patient is typical one with a  uterine pregnancy at approximately [redacted] weeks gestation in her first trimester  with uncontrolled diabetes.   PHYSICAL EXAMINATION:  VITAL SIGNS:  The patient's vital signs are stable.  She is afebrile. Her fasting blood sugar this a.m. was 389. Two-hour p.c.  blood sugar is 318.  HEENT:  Unremarkable.  HEART:  Regular rate and rhythm.  LUNGS:  Clear.  ABDOMEN:  Soft and nontender. Positive bowel sounds.  EXTREMITIES:  Show no pathologic edema. DTRs are 1+ with no clonus. There is  no calf tenderness bilaterally.   ASSESSMENT:  1.  Intrauterine pregnancy in the first trimester.  2.  Diabetes  management for uncontrolled diabetes in early pregnancy.   PLAN:  1.  Orders as written.  2.  Follow Glucommander protocol for blood sugars.  3.  Complete OB ultrasound this a.m.  4.  M.D. to follow and assess the patient's status.      Rica Koyanagi, C.N.M.      Crist Fat Rivard, M.D.  Electronically Signed    SDM/MEDQ  D:  09/30/2005  T:  10/01/2005  Job:  [id number]

## 2011-02-02 NOTE — Discharge Summary (Signed)
[name], [name]           ACCOUNT NO.:  [id number]   MEDICAL RECORD NO.:  [id number]          PATIENT TYPE:  INP   LOCATION:  9304                          FACILITY:  [facility]   PHYSICIAN:  Naima A. Dillard, M.D. DATE OF BIRTH:  02/22/1976   DATE OF ADMISSION:  09/30/2005  DATE OF DISCHARGE:  10/03/2005                                 DISCHARGE SUMMARY   ADMITTING DIAGNOSES:  1.  Intrauterine pregnancy at [redacted] weeks gestation.  2.  Juvenile onset diabetes, poorly controlled.   DISCHARGE DIAGNOSES:  1.  Intrauterine pregnancy at 5 weeks and 5/7 days.  2.  Insulin-dependent diabetes diagnosed at age 17, difficult to control now      with superimposed pregnancy.   PROCEDURES:  Insulin administration.   HOSPITAL COURSE:  [name] is a 35 year old gravida 7, para 2-0-4-2 at  approximately [redacted] weeks gestation who was admitted on September 30, 2005 for  management of pre-existing diabetes and uncontrolled blood sugars.  Her  sugars at home had been in the 200-300s.  Pregnancy is remarkable for  juvenile onset diabetes, diagnosis at age 51 with patient previously taking  Avandamet p.o. for blood sugar control.  She had been receiving care at  [facility] and then at [facility] [place].  History is  also remarkable for two previous vaginal deliveries in 1995 and 2001,  spontaneous miscarriage x3, and a history of a molar pregnancy in 1998.  On  admission her fasting blood sugar was 389, two-hour p.c. was 318.  Her other  vital signs were stable.  Her physical examination was within normal limits.  24-hour urine was begun and Glucommander was implemented for better diabetic  management.  By hospital day one her blood sugars had been ranging from 73  to 134 through the night.  Her Glucommander was providing insulin per hour  per that regimen.  She also was on ampicillin for hydradenitis.  Ultrasound  on the 14th showed 5-week 2-day single intrauterine gestational sac  with no  yolk sac or fetal pole.  Ovaries were normal.  Diabetic teaching nurse was  consulted and she made recommendations.  24-hour urine was done showing  negligible protein and other normal findings.  The diabetic nurse was  consulted.  Her overall recommendation was to implement a four injection  regimen through the day.  Nutritional management was also consulted.  By the  morning of January 17 she was then off the Glucommander and insulin drip but  her blood sugars were still in the 166-241 range.  Her hemoglobin A1c on  January 15 was 12.3.  Dr. Fransico Michael was consulted as endocrinologist.  He saw  the patient and did a full consult work-up.  The recommendation after his  assessment was to recommend increasing her a.m. NPH to 10 units and her h.s.  NPH to 11 units, to discontinue Regular insulin, and to use a sliding scale  at meals, between meals, and at bedtime with NovoLog.  He also recommended  she continue to follow up at [facility] for carbohydrate  teaching and a consult with Dr. Talmage Nap at [facility].  He also  recommended a TSH and free T4 and hemoglobin A1c which were done on the day  of discharge.  Results are still pending.  The patient was agreeable with  these regimens.  I reviewed the regimens with her.  She was able to recount  them and seemed to understand the regimen.  She was deemed to have received  the full benefit of her hospital stay and was discharged home on October 03, 2005.   DISCHARGE INSTRUCTIONS:  The patient is to do a.m. NPH 10 units and h.s. NPH  11 units.  She then will have sliding scales at meals and between meals and  at bedtime with NovoLog.  Please see the patient's chart for more  information.  At meals NovoLog will be given in incremental units.  For  blood sugar less than 120 she will have 11 units, 121-140 12 units, 141-160  13 units, 161-180 14 units, 181-200 15 units, if greater than 200 she is to  take 1  additional unit of NovoLog for every 40 points greater than 200.  Patient will also do between meal CBGs two and a half to three hours after  meals with sliding scale NovoLog given as follows:  Less than 120 she will  use no units, 121-140 1 unit, 141-160 2 units, 161-180 3 units, 181-200 4  units, if greater than 200 she is to use 1 additional unit of NovoLog for  every 40 points greater than 200.  At bedtime she is also to check her sugar  and use a sliding scale as described.  Blood glucose of 160-260 she is to  use 1 unit for every 20 points above 160.  At greater than 260 she is to use  1 unit for every 40 points greater than 260.   DISCHARGE MEDICATIONS:  Please see above for insulin regimen.  Prescriptions  were given for NovoLog and for NPH insulin.  Prescription for test strips  and insulin syringes were also given.   DISCHARGE FOLLOW-UP:  [facility] will arrange for a follow-up  consult visit with Dr. Talmage Nap.  If this is unable to be arranged Dr. Fransico Michael  is willing to be contacted to see the patient.  [facility] will  also arrange for nutritional management classes for the patient to learn  carbohydrate counting.  Patient will be seen at [facility] on  October 12, 2005 at 1:30 p.m. for ultrasound and physician visit.  Patient  will also follow up with [facility] with any other questions or  issues.      [name], C.N.M.      Naima A. Normand Sloop, M.D.  Electronically Signed    VLL/MEDQ  D:  10/03/2005  T:  10/03/2005  Job:  [id number]   cc:   [name], M.D.  Fax: [phone]   Fransico Michael, M.D.  endocrinology   [facility]

## 2014-10-28 ENCOUNTER — Other Ambulatory Visit (HOSPITAL_COMMUNITY): Payer: Self-pay | Admitting: Obstetrics and Gynecology

## 2014-10-28 DIAGNOSIS — Z3142 Aftercare following sterilization reversal: Secondary | ICD-10-CM

## 2014-11-03 ENCOUNTER — Ambulatory Visit (HOSPITAL_COMMUNITY)
Admission: RE | Admit: 2014-11-03 | Discharge: 2014-11-03 | Disposition: A | Discharge status: Home / Self Care | Payer: 59 | Source: Ambulatory Visit | Attending: Obstetrics and Gynecology | Admitting: Obstetrics and Gynecology

## 2014-11-03 DIAGNOSIS — Z3142 Aftercare following sterilization reversal: Secondary | ICD-10-CM | POA: Diagnosis not present

## 2014-11-03 MED ORDER — IOHEXOL 300 MG/ML  SOLN
20.0000 mL | Freq: Once | INTRAMUSCULAR | Status: AC | PRN
  Administered 2014-11-03: 20 mL

## 2015-02-18 ENCOUNTER — Other Ambulatory Visit: Payer: Self-pay | Admitting: Internal Medicine

## 2015-02-18 ENCOUNTER — Ambulatory Visit
Admission: RE | Admit: 2015-02-18 | Discharge: 2015-02-18 | Disposition: A | Discharge status: Home / Self Care | Payer: 59 | Source: Ambulatory Visit | Attending: Internal Medicine | Admitting: Internal Medicine

## 2015-02-18 DIAGNOSIS — M533 Sacrococcygeal disorders, not elsewhere classified: Secondary | ICD-10-CM

## 2016-02-20 ENCOUNTER — Encounter (HOSPITAL_COMMUNITY): Payer: Self-pay | Admitting: *Deleted

## 2016-02-20 ENCOUNTER — Inpatient Hospital Stay (HOSPITAL_COMMUNITY)
Admission: AD | Admit: 2016-02-20 | Discharge: 2016-02-20 | Disposition: A | Discharge status: Home / Self Care | Payer: 59 | Source: Ambulatory Visit | Attending: Obstetrics and Gynecology | Admitting: Obstetrics and Gynecology

## 2016-02-20 DIAGNOSIS — R109 Unspecified abdominal pain: Secondary | ICD-10-CM | POA: Diagnosis present

## 2016-02-20 DIAGNOSIS — N939 Abnormal uterine and vaginal bleeding, unspecified: Secondary | ICD-10-CM | POA: Diagnosis not present

## 2016-02-20 DIAGNOSIS — Z3201 Encounter for pregnancy test, result positive: Secondary | ICD-10-CM | POA: Diagnosis not present

## 2016-02-20 DIAGNOSIS — Z9851 Tubal ligation status: Secondary | ICD-10-CM

## 2016-02-20 DIAGNOSIS — N760 Acute vaginitis: Secondary | ICD-10-CM | POA: Diagnosis not present

## 2016-02-20 DIAGNOSIS — E119 Type 2 diabetes mellitus without complications: Secondary | ICD-10-CM | POA: Diagnosis not present

## 2016-02-20 DIAGNOSIS — Z720 Tobacco use: Secondary | ICD-10-CM | POA: Insufficient documentation

## 2016-02-20 DIAGNOSIS — B9689 Other specified bacterial agents as the cause of diseases classified elsewhere: Secondary | ICD-10-CM

## 2016-02-20 DIAGNOSIS — Z9889 Other specified postprocedural states: Secondary | ICD-10-CM

## 2016-02-20 HISTORY — DX: Unspecified abnormal cytological findings in specimens from vagina: R87.629

## 2016-02-20 HISTORY — DX: Papillomavirus as the cause of diseases classified elsewhere: B97.7

## 2016-02-20 HISTORY — DX: Acute parametritis and pelvic cellulitis: N73.0

## 2016-02-20 HISTORY — DX: Benign neoplasm of connective and other soft tissue, unspecified: D21.9

## 2016-02-20 HISTORY — DX: Type 2 diabetes mellitus without complications: E11.9

## 2016-02-20 LAB — URINE MICROSCOPIC-ADD ON: RBC / HPF: NONE SEEN RBC/hpf (ref 0–5)

## 2016-02-20 LAB — CBC
HEMATOCRIT: 35.4 % — AB (ref 36.0–46.0)
HEMOGLOBIN: 12.2 g/dL (ref 12.0–15.0)
MCH: 28.7 pg (ref 26.0–34.0)
MCHC: 34.5 g/dL (ref 30.0–36.0)
MCV: 83.3 fL (ref 78.0–100.0)
Platelets: 159 10*3/uL (ref 150–400)
RBC: 4.25 MIL/uL (ref 3.87–5.11)
RDW: 14.1 % (ref 11.5–15.5)
WBC: 8.8 10*3/uL (ref 4.0–10.5)

## 2016-02-20 LAB — URINALYSIS, ROUTINE W REFLEX MICROSCOPIC
BILIRUBIN URINE: NEGATIVE
HGB URINE DIPSTICK: NEGATIVE
KETONES UR: NEGATIVE mg/dL
Leukocytes, UA: NEGATIVE
Nitrite: NEGATIVE
PROTEIN: NEGATIVE mg/dL
Specific Gravity, Urine: 1.02 (ref 1.005–1.030)
pH: 5.5 (ref 5.0–8.0)

## 2016-02-20 LAB — WET PREP, GENITAL
SPERM: NONE SEEN
TRICH WET PREP: NONE SEEN
YEAST WET PREP: NONE SEEN

## 2016-02-20 LAB — OB RESULTS CONSOLE GC/CHLAMYDIA: GC PROBE AMP, GENITAL: NEGATIVE

## 2016-02-20 LAB — HCG, QUANTITATIVE, PREGNANCY: HCG, BETA CHAIN, QUANT, S: 209 m[IU]/mL — AB (ref <5)

## 2016-02-20 LAB — POCT PREGNANCY, URINE: PREG TEST UR: POSITIVE — AB

## 2016-02-20 MED ORDER — METRONIDAZOLE 500 MG PO TABS: 500.0000 mg | ORAL_TABLET | Freq: Two times a day (BID) | ORAL | Status: DC

## 2016-02-20 MED ORDER — METRONIDAZOLE 500 MG PO TABS: 500.0000 mg | ORAL_TABLET | Freq: Two times a day (BID) | ORAL | Status: AC

## 2016-02-20 NOTE — Discharge Instructions (Signed)
Vaginal Bleeding During Pregnancy, First Trimester A small amount of bleeding (spotting) from the vagina is relatively common in early pregnancy. It usually stops on its own. Various things may cause bleeding or spotting in early pregnancy. Some bleeding may be related to the pregnancy, and some may not. In most cases, the bleeding is normal and is not a problem. However, bleeding can also be a sign of something serious. Be sure to tell your health care provider about any vaginal bleeding right away. Some possible causes of vaginal bleeding during the first trimester include:  Infection or inflammation of the cervix.  Growths (polyps) on the cervix.  Miscarriage or threatened miscarriage.  Pregnancy tissue has developed outside of the uterus and in a fallopian tube (tubal pregnancy).  Tiny cysts have developed in the uterus instead of pregnancy tissue (molar pregnancy). HOME CARE INSTRUCTIONS  Watch your condition for any changes. The following actions may help to lessen any discomfort you are feeling:  Follow your health care provider's instructions for limiting your activity. If your health care provider orders bed rest, you may need to stay in bed and only get up to use the bathroom. However, your health care provider may allow you to continue light activity.  If needed, make plans for someone to help with your regular activities and responsibilities while you are on bed rest.  Keep track of the number of pads you use each day, how often you change pads, and how soaked (saturated) they are. Write this down.  Do not use tampons. Do not douche.  Do not have sexual intercourse or orgasms until approved by your health care provider.  If you pass any tissue from your vagina, save the tissue so you can show it to your health care provider.  Only take over-the-counter or prescription medicines as directed by your health care provider.  Do not take aspirin because it can make you  bleed.  Keep all follow-up appointments as directed by your health care provider. SEEK MEDICAL CARE IF:  You have any vaginal bleeding during any part of your pregnancy.  You have cramps or labor pains.  You have a fever, not controlled by medicine. SEEK IMMEDIATE MEDICAL CARE IF:   You have severe cramps in your back or belly (abdomen).  You pass large clots or tissue from your vagina.  Your bleeding increases.  You feel light-headed or weak, or you have fainting episodes.  You have chills.  You are leaking fluid or have a gush of fluid from your vagina.  You pass out while having a bowel movement. MAKE SURE YOU:  Understand these instructions.  Will watch your condition.  Will get help right away if you are not doing well or get worse.   This information is not intended to replace advice given to you by your health care provider. Make sure you discuss any questions you have with your health care provider.   Document Released: 06/13/2005 Document Revised: 09/08/2013 Document Reviewed: 05/11/2013 Elsevier Interactive Patient Education 2016 Elsevier Inc. Bacterial Vaginosis Bacterial vaginosis is a vaginal infection that occurs when the normal balance of bacteria in the vagina is disrupted. It results from an overgrowth of certain bacteria. This is the most common vaginal infection in women of childbearing age. Treatment is important to prevent complications, especially in pregnant women, as it can cause a premature delivery. CAUSES  Bacterial vaginosis is caused by an increase in harmful bacteria that are normally present in smaller amounts in the vagina. Several  different kinds of bacteria can cause bacterial vaginosis. However, the reason that the condition develops is not fully understood. RISK FACTORS Certain activities or behaviors can put you at an increased risk of developing bacterial vaginosis, including:  Having a new sex partner or multiple sex  partners.  Douching.  Using an intrauterine device (IUD) for contraception. Women do not get bacterial vaginosis from toilet seats, bedding, swimming pools, or contact with objects around them. SIGNS AND SYMPTOMS  Some women with bacterial vaginosis have no signs or symptoms. Common symptoms include:  Grey vaginal discharge.  A fishlike odor with discharge, especially after sexual intercourse.  Itching or burning of the vagina and vulva.  Burning or pain with urination. DIAGNOSIS  Your health care provider will take a medical history and examine the vagina for signs of bacterial vaginosis. A sample of vaginal fluid may be taken. Your health care provider will look at this sample under a microscope to check for bacteria and abnormal cells. A vaginal pH test may also be done.  TREATMENT  Bacterial vaginosis may be treated with antibiotic medicines. These may be given in the form of a pill or a vaginal cream. A second round of antibiotics may be prescribed if the condition comes back after treatment. Because bacterial vaginosis increases your risk for sexually transmitted diseases, getting treated can help reduce your risk for chlamydia, gonorrhea, HIV, and herpes. HOME CARE INSTRUCTIONS   Only take over-the-counter or prescription medicines as directed by your health care provider.  If antibiotic medicine was prescribed, take it as directed. Make sure you finish it even if you start to feel better.  Tell all sexual partners that you have a vaginal infection. They should see their health care provider and be treated if they have problems, such as a mild rash or itching.  During treatment, it is important that you follow these instructions:  Avoid sexual activity or use condoms correctly.  Do not douche.  Avoid alcohol as directed by your health care provider.  Avoid breastfeeding as directed by your health care provider. SEEK MEDICAL CARE IF:   Your symptoms are not improving  after 3 days of treatment.  You have increased discharge or pain.  You have a fever. MAKE SURE YOU:   Understand these instructions.  Will watch your condition.  Will get help right away if you are not doing well or get worse. FOR MORE INFORMATION  Centers for Disease Control and Prevention, Division of STD Prevention: AppraiserFraud.fi American Sexual Health Association (ASHA): www.ashastd.org    This information is not intended to replace advice given to you by your health care provider. Make sure you discuss any questions you have with your health care provider.   Document Released: 09/03/2005 Document Revised: 09/24/2014 Document Reviewed: 04/15/2013 Elsevier Interactive Patient Education [employer].

## 2016-02-20 NOTE — MAU Provider Note (Signed)
[name] is a 40yo, [id number] LMP 01/22/16 presenting to MAU unannounced with C/o cramping, vaginal spotting and missed period.  Pt states she suspects she is pregnant due to missed period  And recent cramping.  Pt reports she has a history of a tubal reversal and is concerned about the possibility of tubal pregnancy. Reports recent yeast infection treated with OTC meds.     History     Patient Active Problem List   Diagnosis Date Noted  . History of reversal of tubal ligation 02/20/2016  . H/O tubal ligation 02/20/2016  . Diabetes mellitus ([place]) 02/20/2016  . Vaginal bleeding 02/20/2016    Chief Complaint  Patient presents with  . Abdominal Pain  . Vaginal Bleeding   HPI  OB History    Gravida Para Term Preterm AB TAB SAB Ectopic Multiple Living   9 3   4 1 2 1  3       Past Medical History  Diagnosis Date  . Diabetes mellitus without complication ([place])     type one  . Fibroid   . Vaginal Pap smear, abnormal   . HPV in female   . PID (acute pelvic inflammatory disease)     Past Surgical History  Procedure Laterality Date  . Tubal ligation    . Tubal reversal    . Carpel tunnel    . Rotator cuff repair    . Laporoscopy    . Dilation and curettage of uterus    . Colposcopy w/ biopsy / curettage      History reviewed. No pertinent family history.  Social History  Substance Use Topics  . Smoking status: Current Every Day Smoker  . Smokeless tobacco: None  . Alcohol Use: Yes     Comment: not since today Jun 5    Allergies:  Allergies  Allergen Reactions  . Sulfa Antibiotics Anaphylaxis    No prescriptions prior to admission    ROS Physical Exam   Blood pressure 134/86, pulse 94, temperature 98.7 F (37.1 C), temperature source Oral, resp. rate 16, height 5\' 1"  (1.549 m), weight 76.204 kg (168 lb), last menstrual period 01/22/2016.  Results for orders placed or performed during the hospital encounter of 02/20/16 (from the past 24 hour(s))   Urinalysis, Routine w reflex microscopic (not at [facility])     Status: Abnormal   Collection Time: 02/20/16  8:39 PM  Result Value Ref Range   Color, Urine YELLOW YELLOW   APPearance CLEAR CLEAR   Specific Gravity, Urine 1.020 1.005 - 1.030   pH 5.5 5.0 - 8.0   Glucose, UA >1000 (A) NEGATIVE mg/dL   Hgb urine dipstick NEGATIVE NEGATIVE   Bilirubin Urine NEGATIVE NEGATIVE   Ketones, ur NEGATIVE NEGATIVE mg/dL   Protein, ur NEGATIVE NEGATIVE mg/dL   Nitrite NEGATIVE NEGATIVE   Leukocytes, UA NEGATIVE NEGATIVE  Urine microscopic-add on     Status: Abnormal   Collection Time: 02/20/16  8:39 PM  Result Value Ref Range   Squamous Epithelial / LPF 0-5 (A) NONE SEEN   WBC, UA 0-5 0 - 5 WBC/hpf   RBC / HPF NONE SEEN 0 - 5 RBC/hpf   Bacteria, UA RARE (A) NONE SEEN   Urine-Other MUCOUS PRESENT   Pregnancy, urine POC     Status: Abnormal   Collection Time: 02/20/16  8:56 PM  Result Value Ref Range   Preg Test, Ur POSITIVE (A) NEGATIVE  Wet prep, genital     Status: Abnormal   Collection Time: 02/20/16  9:50 PM  Result Value Ref Range   Yeast Wet Prep HPF POC NONE SEEN NONE SEEN   Trich, Wet Prep NONE SEEN NONE SEEN   Clue Cells Wet Prep HPF POC PRESENT (A) NONE SEEN   WBC, Wet Prep HPF POC MODERATE (A) NONE SEEN   Sperm NONE SEEN   hCG, quantitative, pregnancy     Status: Abnormal   Collection Time: 02/20/16  9:52 PM  Result Value Ref Range   hCG, Beta Chain, Quant, S 209 (H) <5 mIU/mL  ABO/Rh     Status: None (Preliminary result)   Collection Time: 02/20/16  9:52 PM  Result Value Ref Range   ABO/RH(D) AB POS   CBC     Status: Abnormal   Collection Time: 02/20/16  9:52 PM  Result Value Ref Range   WBC 8.8 4.0 - 10.5 K/uL   RBC 4.25 3.87 - 5.11 MIL/uL   Hemoglobin 12.2 12.0 - 15.0 g/dL   HCT 35.4 (L) 36.0 - 46.0 %   MCV 83.3 78.0 - 100.0 fL   MCH 28.7 26.0 - 34.0 pg   MCHC 34.5 30.0 - 36.0 g/dL   RDW 14.1 11.5 - 15.5 %   Platelets 159 150 - 400 K/uL    Physical Exam   Constitutional: She is oriented to person, place, and time. She appears well-developed and well-nourished.  HENT:  Head: Normocephalic.  Eyes: Pupils are equal, round, and reactive to light.  Neck: Normal range of motion.  Cardiovascular: Normal rate.   Respiratory: Effort normal.  GI: Soft.  Genitourinary: Uterus is tender. Cervix exhibits no motion tenderness, no discharge and no friability. Vaginal discharge found.  Cervix Closed/thick Brown discharge present in vaginal vault    Musculoskeletal: Normal range of motion.  Neurological: She is alert and oriented to person, place, and time. She has normal reflexes.  Skin: Skin is warm and dry.  Psychiatric: She has a normal mood and affect. Her behavior is normal.     ED Course  Assessment: LMP 01/22/16 Positive Pregnancy test B-Hcg,  209 Bacterial vaginosis  Plan: DC home in stable condition F/U B-HCG at [place] office in 2-3 days Flagyl BID, x7 days Call PRN   Lavetta Nielsen CNM, MSN 02/20/2016 11:25 PM

## 2016-02-20 NOTE — MAU Note (Signed)
Patient presents with possible pregnancy and c/o abdominal pain and vaginal bleeding. Denies discharge.

## 2016-02-21 LAB — ABO/RH: ABO/RH(D): AB POS

## 2016-02-22 LAB — GC/CHLAMYDIA PROBE AMP (~~LOC~~) NOT AT ARMC
CHLAMYDIA, DNA PROBE: NEGATIVE
NEISSERIA GONORRHEA: NEGATIVE

## 2016-03-02 LAB — OB RESULTS CONSOLE RPR: RPR: NONREACTIVE

## 2016-03-02 LAB — OB RESULTS CONSOLE RUBELLA ANTIBODY, IGM: Rubella: IMMUNE

## 2016-03-02 LAB — OB RESULTS CONSOLE HEPATITIS B SURFACE ANTIGEN: HEP B S AG: NEGATIVE

## 2016-03-02 LAB — OB RESULTS CONSOLE HIV ANTIBODY (ROUTINE TESTING): HIV: NONREACTIVE

## 2016-04-12 ENCOUNTER — Encounter (HOSPITAL_COMMUNITY): Payer: Self-pay | Admitting: Advanced Practice Midwife

## 2016-04-19 ENCOUNTER — Ambulatory Visit (HOSPITAL_COMMUNITY)
Admission: RE | Admit: 2016-04-19 | Discharge: 2016-04-19 | Disposition: A | Discharge status: Home / Self Care | Payer: 59 | Source: Ambulatory Visit | Attending: Advanced Practice Midwife | Admitting: Advanced Practice Midwife

## 2016-04-19 ENCOUNTER — Encounter: Payer: 59 | Attending: Advanced Practice Midwife | Admitting: *Deleted

## 2016-04-19 DIAGNOSIS — E1065 Type 1 diabetes mellitus with hyperglycemia: Secondary | ICD-10-CM

## 2016-04-19 DIAGNOSIS — Z713 Dietary counseling and surveillance: Secondary | ICD-10-CM | POA: Diagnosis present

## 2016-04-19 DIAGNOSIS — E119 Type 2 diabetes mellitus without complications: Secondary | ICD-10-CM | POA: Diagnosis not present

## 2016-04-19 DIAGNOSIS — O24011 Pre-existing diabetes mellitus, type 1, in pregnancy, first trimester: Principal | ICD-10-CM

## 2016-04-19 NOTE — Patient Instructions (Signed)
Plan:  Aim for 3 Carb Choices per meal (45 grams) +/- 1 either way  Aim for 0-2 Carbs per snack if hungry  Include protein in moderation with your meals and snacks Consider reading food labels for Total Carbohydrate of foods Consider checking BG at alternate times per day as directed by MD  Continue taking medication as directed by MD

## 2016-04-19 NOTE — Progress Notes (Signed)
  Patient was seen on 04/19/2016 for DM 1 Diabetes self-management education. She states she has had DM 1 since the age of 54. He has had multiple pregnancies, the last one in 2007 she had an insulin pump, but doesn't remember which one. She states her control was better with it and she liked not taking shots.   She works for [employer] in Therapist, art 5 days a week. She has no activity level other than taking the stairs at work to 2nd and 3rd floors some throughout the day. She states she is consistent in taking her insulin. She has not been able to check her BG as often as recommended by MD's yet. She tests FBG, most pre meal times and at bedtime. Not able to get post meal sugars tested yet. She states Dr. Buddy Duty increased her insulin doses yesterday (04/18/16) Novolin N (vial) @ 30 units BID and Novolog (pen) @ 20 units pre meal + correction of 1 unit / 20 mg/dl above 100 mg/dl. She states her FBG improved this AM from 145 to 132 this AM. Her comfort with Carb Counting is 2/10, so we will review that today  The following learning objectives were met by the patient during this visit:   States why dietary management is important in controlling blood glucose  Describes the effects each nutrient has on blood glucose levels  Demonstrates ability to create a balanced meal plan  Demonstrates carbohydrate counting   States when to check blood glucose levels Demonstrates proper blood glucose monitoring techniques (patient was given a new one by Dr. Buddy Duty which sends results to an APP on her phone)  *Patient received handouts:  Nutrition Diabetes and Pregnancy  Carbohydrate Counting List  Patient will be seen for follow-up in 1 week [facility] to discuss insulin pump and CGM options per Dr. Cindra Eves separate referral.

## 2016-04-23 ENCOUNTER — Encounter (HOSPITAL_COMMUNITY): Payer: Self-pay

## 2016-04-24 ENCOUNTER — Ambulatory Visit (HOSPITAL_COMMUNITY): Payer: 59

## 2016-04-24 ENCOUNTER — Encounter (HOSPITAL_COMMUNITY): Payer: Self-pay

## 2016-04-24 ENCOUNTER — Other Ambulatory Visit (HOSPITAL_COMMUNITY): Payer: Self-pay

## 2016-04-24 ENCOUNTER — Ambulatory Visit (HOSPITAL_COMMUNITY)
Admission: RE | Admit: 2016-04-24 | Discharge: 2016-04-24 | Disposition: A | Discharge status: Home / Self Care | Payer: 59 | Source: Ambulatory Visit | Attending: Advanced Practice Midwife | Admitting: Advanced Practice Midwife

## 2016-04-24 VITALS — BP 112/70 | HR 91 | Wt 183.8 lb

## 2016-04-24 DIAGNOSIS — Z87891 Personal history of nicotine dependence: Secondary | ICD-10-CM | POA: Diagnosis not present

## 2016-04-24 DIAGNOSIS — Z3A13 13 weeks gestation of pregnancy: Secondary | ICD-10-CM | POA: Insufficient documentation

## 2016-04-24 DIAGNOSIS — E109 Type 1 diabetes mellitus without complications: Secondary | ICD-10-CM | POA: Insufficient documentation

## 2016-04-24 DIAGNOSIS — O24011 Pre-existing diabetes mellitus, type 1, in pregnancy, first trimester: Secondary | ICD-10-CM | POA: Insufficient documentation

## 2016-04-24 DIAGNOSIS — O24119 Pre-existing diabetes mellitus, type 2, in pregnancy, unspecified trimester: Secondary | ICD-10-CM | POA: Insufficient documentation

## 2016-04-24 NOTE — Progress Notes (Signed)
[facility]  Patient Name: [name] Record Number:  [id number] Date of Birth: Apr 03, 1976 Requesting Physician Name:  Ree Edman* Date of Service: 04/24/2016  Chief Complaint Diabetes Mellitus in pregnancy  History of Present Illness [name] was seen today secondary to diabetes mellitus at the request of Ree Edman*.  The patient is a 40 y.o. [id number],UR 79w2dwith an EDD of 10/28/2016, by Last Menstrual Period dating method.  She presents today to discuss the risks associated with diabetes in pregnancy, the goals of insulin therapy in pregnancy, and additional fetal monitoring needed in women with diabetes.  She has no acute issues or complaints today.  Review of Systems Pertinent items are noted in HPI.  Patient History OB History  Gravida Para Term Preterm AB Living  _0 SAB TAB Ectopic Multiple Live Births  _1 # Outcome Date GA Lbr Len/2nd Weight Sex Delivery Anes PTL Lv  8 Current           7 Term           6 Ectopic           5 SAB           4 SAB           3 TAB           2 Preterm           1 Preterm               Past Medical History:  Diagnosis Date  . Diabetes mellitus without complication ([place]    type one  . Fibroid   . HPV in female   . PID (acute pelvic inflammatory disease)   . Vaginal Pap smear, abnormal     Past Surgical History:  Procedure Laterality Date  . carpel tunnel    . COLPOSCOPY W/ BIOPSY / CURETTAGE    . DILATION AND CURETTAGE OF UTERUS    . laporoscopy    . ROTATOR CUFF REPAIR    . TUBAL LIGATION    . tubal reversal      Social History   Social History  . Marital status: Single    Spouse name: N/A  . Number of children: N/A  . Years of education: N/A   Social History Main Topics  . Smoking status: Former SResearch scientist (life sciences) . Smokeless tobacco: None  . Alcohol use Yes     Comment: not since today Jun 5  . Drug use: No  . Sexual activity:  Yes    Birth control/ protection: Other-see comments     Comment: tubal reversal in 2015   Other Topics Concern  . None   Social History Narrative  . None    History reviewed. No pertinent family history. In addition, the patient has no family history of mental retardation, birth defects, or genetic diseases.  Physical Examination Vitals:   04/24/16 1009  BP: 112/70  Pulse: 91   General appearance - alert, well appearing, and in no distress Abdomen - soft, nontender, nondistended, no masses or organomegaly Extremities - no pedal edema noted  Assessment and Recommendations 1.  Diabetes in pregnancy.   Ms. GHeyermet with a dietician and/or diabetes educator to discuss dietary modification, blood glucose testing.  She was not aware of the blood glucose goals during pregnancy.  I explained that tight glycemic control is needed in pregnancy  to prevent complications.  We discussed that her fasting blood sugars should be less than 90 mg/dl and 2-hour postprandial less than 120 mg/dl with avoidance of hypoglycemia.  I recommend a baseline set of preeclampsia labs be ordered including a CBC, complete metabolic panel, and 24 hour urine collection, if not already done.  [name] reports that her hemoglobin A1c was approximately 10 or 11 last time it was checked, which was approximately 1 month ago.  This is a significant elevation and is associated with an increased risk of birth defects.  However, as she is past the embryonic period and nearly done with the first trimester, any potential birth defects have already occurred.  Thus, while it would be reasonable to hospitalize [name] to more rapidly control her blood sugars, this is unlikely to reduce [name] risk of birth defects.  However, she should have a detailed fetal anatomic survey and a fetal echo to determine if, in fact, any birth defects are present.  Review of the fasting values in her iPhone app were all in the  160-180 range which are notably elevated.  As her endocrinologist, Dr. Buddy Duty, is managing her insulin, I will defer any changes to him at this time.  However, I asked [name] to discuss the above mentioned glycemic goals as continuing with blood sugars this high can lead to miscarriage and stillbirth.  As pregnancy progresses she will require fetal monitoring.  She should have serial growth ultrasound approximately every 4 weeks after her anatomy scan.  In addition, she will also need twice weekly fetal testing starting at 32 weeks.  I spent 30 minutes with [name] today of which 50% was face-to-face counseling.  Thank you for referring [name] to the [facility].  Please do not hesitate to contact us with questions.   Jolyn Lent, MD

## 2016-04-26 ENCOUNTER — Ambulatory Visit (HOSPITAL_COMMUNITY): Admission: RE | Admit: 2016-04-26 | Payer: 59 | Source: Ambulatory Visit

## 2016-05-10 ENCOUNTER — Ambulatory Visit (HOSPITAL_COMMUNITY): Payer: 59

## 2016-06-28 ENCOUNTER — Ambulatory Visit (HOSPITAL_COMMUNITY)
Admission: RE | Admit: 2016-06-28 | Discharge: 2016-06-28 | Disposition: A | Discharge status: Home / Self Care | Payer: 59 | Source: Ambulatory Visit | Attending: Advanced Practice Midwife | Admitting: Advanced Practice Midwife

## 2016-06-28 ENCOUNTER — Encounter: Payer: 59 | Attending: Advanced Practice Midwife | Admitting: *Deleted

## 2016-06-28 DIAGNOSIS — O24119 Pre-existing diabetes mellitus, type 2, in pregnancy, unspecified trimester: Secondary | ICD-10-CM | POA: Insufficient documentation

## 2016-06-28 DIAGNOSIS — Z9641 Presence of insulin pump (external) (internal): Secondary | ICD-10-CM | POA: Insufficient documentation

## 2016-06-28 DIAGNOSIS — E119 Type 2 diabetes mellitus without complications: Secondary | ICD-10-CM | POA: Insufficient documentation

## 2016-06-28 DIAGNOSIS — Z3A00 Weeks of gestation of pregnancy not specified: Secondary | ICD-10-CM | POA: Insufficient documentation

## 2016-06-28 DIAGNOSIS — Z713 Dietary counseling and surveillance: Secondary | ICD-10-CM | POA: Insufficient documentation

## 2016-06-28 DIAGNOSIS — O24112 Pre-existing diabetes mellitus, type 2, in pregnancy, second trimester: Secondary | ICD-10-CM

## 2016-06-28 NOTE — Progress Notes (Signed)
Plan for today's visit is to discuss her options for moving to insulin pump therapy. Insulin Pump Evaluation Visit:  Date: 06/28/2016   Appt start time: 1530 end time:  1600.  Assessment:  This patient has DM 2 with pregnancy and their primary concerns today: to learn about her options to move towards pump therapy as she is taking 8 injections per day along with SMBG 4-8 times a day.   MEDICATIONS: Basal Insulin: 145 units of Novolin at 75 units in AM and 50-70 units in PM via pen or syringe Bolus Insulin: 120 units of Novolog at Carb ratio of 1 units per each gram of arbohydrate plus a correction dose as needed via pen or syringe Total Daily Dose of insulin per day 250-300 units per day Other diabetes medications: not now  Patient states they are currently testing BG more than 4 times per day Patient does not currently have Ketone Strips  Current meal time insulin:  Insulin Carb Ratio of 1 unit/1 grams Current Correction insulin:   Insulin Sensitivity Factor of 1 unit/15 mg/dl above Target of 80 mg/dl   Patient states knowledge of Carb Counting is 8 on a scale of 1-10  Last A1c was 5.9%  Patient states they have had hypoglycemia usually during the night, several times in the past month Patient states their biggest barrier with diabetes is taking so many injections  Intervention:    Taught difference between delivery of insulin via syringe/pen compared to insulin pump.  Demonstrated improved insulin delivery via pump due to improved accuracy of dose and flexibility of adjusting bolus insulin based on carb intake and BG correction.  Showed patient the following pumps: Medtronic, as her insurance is [employer] which only covers Medtronic pumps  Demonstrated pump, insulin reservoir and infusion set options, and button pushing for bolus delivery of insulin through the pump  Explained importance of testing BG at least 4 times per day for appropriate correction of high BG and  prevention of DKA as applicable.  Emphasized importance of follow up after Pump Start for appropriate pump setting adjustments and on-going training on more advanced features.  Discussed current Continuous Glucose Monitoring options (if needed)  Handouts given during visit include:  Introduction to Pump Therapy Handout  Monitoring/Evaluation:    Patient does  want to continue with pursuit of Medtronic insulin pump.  Patient asked me to contact local [facility] via email, which I have done, so they can start the process of obtaining the pump. Contact information also provided to the patient.  Once pump is shipped, patient to call this office to set up training.

## 2016-07-26 ENCOUNTER — Inpatient Hospital Stay (HOSPITAL_COMMUNITY)
Admission: AD | Admit: 2016-07-26 | Discharge: 2016-07-26 | Disposition: A | Discharge status: Home / Self Care | Payer: 59 | Source: Ambulatory Visit | Attending: Obstetrics and Gynecology | Admitting: Obstetrics and Gynecology

## 2016-07-26 ENCOUNTER — Encounter (HOSPITAL_COMMUNITY): Payer: Self-pay | Admitting: *Deleted

## 2016-07-26 DIAGNOSIS — Z3A26 26 weeks gestation of pregnancy: Secondary | ICD-10-CM | POA: Diagnosis not present

## 2016-07-26 DIAGNOSIS — Z87891 Personal history of nicotine dependence: Secondary | ICD-10-CM | POA: Insufficient documentation

## 2016-07-26 DIAGNOSIS — Z794 Long term (current) use of insulin: Secondary | ICD-10-CM | POA: Diagnosis not present

## 2016-07-26 DIAGNOSIS — O24119 Pre-existing diabetes mellitus, type 2, in pregnancy, unspecified trimester: Secondary | ICD-10-CM

## 2016-07-26 DIAGNOSIS — M545 Low back pain: Secondary | ICD-10-CM | POA: Diagnosis present

## 2016-07-26 DIAGNOSIS — O26892 Other specified pregnancy related conditions, second trimester: Secondary | ICD-10-CM

## 2016-07-26 DIAGNOSIS — Z79899 Other long term (current) drug therapy: Secondary | ICD-10-CM | POA: Insufficient documentation

## 2016-07-26 DIAGNOSIS — O99891 Other specified diseases and conditions complicating pregnancy: Secondary | ICD-10-CM

## 2016-07-26 DIAGNOSIS — O24112 Pre-existing diabetes mellitus, type 2, in pregnancy, second trimester: Secondary | ICD-10-CM | POA: Diagnosis not present

## 2016-07-26 DIAGNOSIS — Z9889 Other specified postprocedural states: Secondary | ICD-10-CM | POA: Diagnosis not present

## 2016-07-26 DIAGNOSIS — Z888 Allergy status to other drugs, medicaments and biological substances status: Secondary | ICD-10-CM | POA: Diagnosis not present

## 2016-07-26 DIAGNOSIS — M549 Dorsalgia, unspecified: Secondary | ICD-10-CM

## 2016-07-26 DIAGNOSIS — N949 Unspecified condition associated with female genital organs and menstrual cycle: Secondary | ICD-10-CM | POA: Diagnosis not present

## 2016-07-26 DIAGNOSIS — R102 Pelvic and perineal pain: Secondary | ICD-10-CM | POA: Diagnosis present

## 2016-07-26 DIAGNOSIS — O9989 Other specified diseases and conditions complicating pregnancy, childbirth and the puerperium: Secondary | ICD-10-CM

## 2016-07-26 LAB — WET PREP, GENITAL
CLUE CELLS WET PREP: NONE SEEN
SPERM: NONE SEEN
TRICH WET PREP: NONE SEEN
YEAST WET PREP: NONE SEEN

## 2016-07-26 LAB — URINALYSIS, ROUTINE W REFLEX MICROSCOPIC
Bilirubin Urine: NEGATIVE
GLUCOSE, UA: NEGATIVE mg/dL
Hgb urine dipstick: NEGATIVE
Ketones, ur: NEGATIVE mg/dL
LEUKOCYTES UA: NEGATIVE
NITRITE: NEGATIVE
PH: 6 (ref 5.0–8.0)
PROTEIN: NEGATIVE mg/dL
Specific Gravity, Urine: 1.025 (ref 1.005–1.030)

## 2016-07-26 LAB — GLUCOSE, CAPILLARY: Glucose-Capillary: 114 mg/dL — ABNORMAL HIGH (ref 65–99)

## 2016-07-26 MED ORDER — CYCLOBENZAPRINE HCL 10 MG PO TABS: 10.0000 mg | ORAL_TABLET | Freq: Three times a day (TID) | ORAL | 1 refills | Status: DC | PRN

## 2016-07-26 MED ORDER — ACETAMINOPHEN 500 MG PO TABS
1000.0000 mg | ORAL_TABLET | Freq: Four times a day (QID) | ORAL | Status: DC | PRN
  Administered 2016-07-26: 1000 mg via ORAL
  Filled 2016-07-26: qty 2

## 2016-07-26 NOTE — MAU Note (Signed)
Pt reports having  Constant lower back pain that goes down to her legs  since this afternoon. Also  C/o a sharp pain in her left side/ad. That is sharp  And comes and goes. A luittle less fetal movement than usual but still feeling baby move. dnies vag bleeding or discharge.

## 2016-07-26 NOTE — MAU Provider Note (Signed)
History     CSN: [id number]  Arrival date and time: 07/26/16 1734   None     Chief Complaint  Patient presents with  . Back Pain   [id number] @26 .4 weeks here with lower back pain and LLQ pain. She describes the back pain as constant and dull and onset today She describes the LLQ pain as sharp and intermittent and onset x3 days. She has not used analgesics. She denies VB, LOF, and ctx. She reports +FM. She denies urinary sx. She has hx of Type 1 DM, and BS was fasting 91 this morning and 125 after last meal. She reports a little more than 1 bottle of water today.    OB History    Gravida Para Term Preterm AB Living   8 3 1 2 4 3    SAB TAB Ectopic Multiple Live Births   2 1 1           Past Medical History:  Diagnosis Date  . Diabetes mellitus without complication ([place])    type one  . Fibroid   . HPV in female   . PID (acute pelvic inflammatory disease)   . Vaginal Pap smear, abnormal     Past Surgical History:  Procedure Laterality Date  . carpel tunnel    . COLPOSCOPY W/ BIOPSY / CURETTAGE    . DILATION AND CURETTAGE OF UTERUS    . laporoscopy    . ROTATOR CUFF REPAIR    . TUBAL LIGATION    . tubal reversal      History reviewed. No pertinent family history.  Social History  Substance Use Topics  . Smoking status: Former Research scientist (life sciences)  . Smokeless tobacco: Former Systems developer  . Alcohol use Yes     Comment: not since today Jun 5    Allergies:  Allergies  Allergen Reactions  . Sulfa Antibiotics Anaphylaxis    Prescriptions Prior to Admission  Medication Sig Dispense Refill Last Dose  . diphenhydrAMINE (BENADRYL) 25 MG tablet Take 25 mg by mouth every 6 (six) hours as needed for allergies or sleep.   07/25/2016 at Unknown time  . Melatonin-Pyridoxine (MELATIN PO) Take 1 mg by mouth at bedtime as needed (sleep).   Past Week at Unknown time  . NOVOLIN N 100 UNIT/ML injection 50-65 Units 2 (two) times daily. 65 units in the morning and 50 units at night   07/26/2016 at  Unknown time  . NOVOLOG FLEXPEN 100 UNIT/ML FlexPen Inject 1-30 Units into the skin 3 (three) times daily with meals. Sliding scale   07/26/2016 at 1230  . Prenatal Vit-Fe Fumarate-FA (PRENATAL MULTIVITAMIN) TABS tablet Take 1 tablet by mouth daily at 12 noon.   07/26/2016 at Unknown time    Review of Systems  Constitutional: Negative.   Gastrointestinal: Positive for abdominal pain.  Musculoskeletal: Positive for back pain.   Physical Exam   Blood pressure 140/68, pulse 97, temperature 98.5 F (36.9 C), temperature source Oral, resp. rate 18, height 5\' 1"  (1.549 m), weight 87.7 kg (193 lb 6.4 oz), last menstrual period 01/22/2016.  Physical Exam  Constitutional: She is oriented to person, place, and time. She appears well-developed and well-nourished. No distress.  HENT:  Head: Normocephalic and atraumatic.  Neck: Normal range of motion.  Cardiovascular: Normal rate.   Respiratory: Effort normal.  GI: Soft. She exhibits no distension. There is no tenderness. There is no rebound.  gravid  Genitourinary:  Genitourinary Comments: External: no lesions Vagina: rugated, parous, thin white discharge SVE: closed/thick  Musculoskeletal:  Normal range of motion.       Lumbar back: She exhibits tenderness (L>R).  Neurological: She is alert and oriented to person, place, and time.  Skin: Skin is warm and dry.  Psychiatric: She has a normal mood and affect.   EFM: 145 bpm, mod variability, + accels, no decels Toco: none  MAU Course  Procedures Tylenol 1 g po Po hydration  MDM Labs ordered and reviewed. No evidence of PTL, pyelo, or pelvic infection. Presentation, clinical findings, and plan discussed with Dr. Nelda Marseille. Stable for discharge home.  Assessment and Plan   1. Back pain affecting pregnancy in second trimester   2. Pre-existing type 2 diabetes affecting pregnancy, antepartum   3. Round ligament pain    Discharge home Follow up as scheduled in office next week Comfort  measures PTL precautions    Medication List    STOP taking these medications   MELATIN PO     TAKE these medications   cyclobenzaprine 10 MG tablet Commonly known as:  FLEXERIL Take 1 tablet (10 mg total) by mouth every 8 (eight) hours as needed for muscle spasms.   diphenhydrAMINE 25 MG tablet Commonly known as:  BENADRYL Take 25 mg by mouth every 6 (six) hours as needed for allergies or sleep.   NOVOLIN N 100 UNIT/ML injection Generic drug:  insulin NPH Human 50-65 Units 2 (two) times daily. 65 units in the morning and 50 units at night   NOVOLOG FLEXPEN 100 UNIT/ML FlexPen Generic drug:  insulin aspart Inject 1-30 Units into the skin 3 (three) times daily with meals. Sliding scale   prenatal multivitamin Tabs tablet Take 1 tablet by mouth daily at 12 noon.      Julianne Handler, CNM 07/26/2016, 6:27 PM

## 2016-07-26 NOTE — Discharge Instructions (Signed)
Back Pain in Pregnancy °Back pain during pregnancy is common. It happens in about half of all pregnancies. It is important for you and your baby that you remain active during your pregnancy. If you feel that back pain is not allowing you to remain active or sleep well, it is time to see your caregiver. Back pain may be caused by several factors related to changes during your pregnancy. Fortunately, unless you had trouble with your back before your pregnancy, the pain is likely to get better after you deliver. °Low back pain usually occurs between the fifth and seventh months of pregnancy. It can, however, happen in the first couple months. Factors that increase the risk of back problems include:  °· Previous back problems. °· Injury to your back. °· Having twins or multiple births. °· A chronic cough. °· Stress. °· Job-related repetitive motions. °· Muscle or spinal disease in the back. °· Family history of back problems, ruptured (herniated) discs, or osteoporosis. °· Depression, anxiety, and panic attacks. °CAUSES  °· When you are pregnant, your body produces a hormone called relaxin. This hormone makes the ligaments connecting the low back and pubic bones more flexible. This flexibility allows the baby to be delivered more easily. When your ligaments are loose, your muscles need to work harder to support your back. Soreness in your back can come from tired muscles. Soreness can also come from back tissues that are irritated since they are receiving less support. °· As the baby grows, it puts pressure on the nerves and blood vessels in your pelvis. This can cause back pain. °· As the baby grows and gets heavier during pregnancy, the uterus pushes the stomach muscles forward and changes your center of gravity. This makes your back muscles work harder to maintain good posture. °SYMPTOMS  °Lumbar pain during pregnancy °Lumbar pain during pregnancy usually occurs at or above the waist in the center of the back. There  may be pain and numbness that radiates into your leg or foot. This is similar to low back pain experienced by non-pregnant women. It usually increases with sitting for long periods of time, standing, or repetitive lifting. Tenderness may also be present in the muscles along your upper back. °Posterior pelvic pain during pregnancy °Pain in the back of the pelvis is more common than lumbar pain in pregnancy. It is a deep pain felt in your side at the waistline, or across the tailbone (sacrum), or in both places. You may have pain on one or both sides. This pain can also go into the buttocks and backs of the upper thighs. Pubic and groin pain may also be present. The pain does not quickly resolve with rest, and morning stiffness may also be present. °Pelvic pain during pregnancy can be brought on by most activities. A high level of fitness before and during pregnancy may or may not prevent this problem. Labor pain is usually 1 to 2 minutes apart, lasts for about 1 minute, and involves a bearing down feeling or pressure in your pelvis. However, if you are at term with the pregnancy, constant low back pain can be the beginning of early labor, and you should be aware of this. °DIAGNOSIS  °X-rays of the back should not be done during the first 12 to 14 weeks of the pregnancy and only when absolutely necessary during the rest of the pregnancy. MRIs do not give off radiation and are safe during pregnancy. MRIs also should only be done when absolutely necessary. °HOME CARE INSTRUCTIONS °· Exercise   as directed by your caregiver. Exercise is the most effective way to prevent or manage back pain. If you have a back problem, it is especially important to avoid sports that require sudden body movements. Swimming and walking are great activities. °· Do not stand in one place for long periods of time. °· Do not wear high heels. °· Sit in chairs with good posture. Use a pillow on your lower back if necessary. Make sure your head  rests over your shoulders and is not hanging forward. °· Try sleeping on your side, preferably the left side, with a pillow or two between your legs. If you are sore after a night's rest, your bed may be too soft. Try placing a board between your mattress and box spring. °· Listen to your body when lifting. If you are experiencing pain, ask for help or try bending your knees more so you can use your leg muscles rather than your back muscles. Squat down when picking up something from the floor. Do not bend over. °· Eat a healthy diet. Try to gain weight within your caregiver's recommendations. °· Use heat or cold packs 3 to 4 times a day for 15 minutes to help with the pain. °· Only take over-the-counter or prescription medicines for pain, discomfort, or fever as directed by your caregiver. °Sudden (acute) back pain °· Use bed rest for only the most extreme, acute episodes of back pain. Prolonged bed rest over 48 hours will aggravate your condition. °· Ice is very effective for acute conditions. °¨ Put ice in a plastic bag. °¨ Place a towel between your skin and the bag. °¨ Leave the ice on for 10 to 20 minutes every 2 hours, or as needed. °· Using heat packs for 30 minutes prior to activities is also helpful. °Continued back pain °See your caregiver if you have continued problems. Your caregiver can help or refer you for appropriate physical therapy. With conditioning, most back problems can be avoided. Sometimes, a more serious issue may be the cause of back pain. You should be seen right away if new problems seem to be developing. Your caregiver may recommend: °· A maternity girdle. °· An elastic sling. °· A back brace. °· A massage therapist or acupuncture. °SEEK MEDICAL CARE IF:  °· You are not able to do most of your daily activities, even when taking the pain medicine you were given. °· You need a referral to a physical therapist or chiropractor. °· You want to try acupuncture. °SEEK IMMEDIATE MEDICAL CARE  IF: °· You develop numbness, tingling, weakness, or problems with the use of your arms or legs. °· You develop severe back pain that is no longer relieved with medicines. °· You have a sudden change in bowel or bladder control. °· You have increasing pain in other areas of the body. °· You develop shortness of breath, dizziness, or fainting. °· You develop nausea, vomiting, or sweating. °· You have back pain which is similar to labor pains. °· You have back pain along with your water breaking or vaginal bleeding. °· You have back pain or numbness that travels down your leg. °· Your back pain developed after you fell. °· You develop pain on one side of your back. You may have a kidney stone. °· You see blood in your urine. You may have a bladder infection or kidney stone. °· You have back pain with blisters. You may have shingles. °Back pain is fairly common during pregnancy but should not be accepted as just part of   the process. Back pain should always be treated as soon as possible. This will make your pregnancy as pleasant as possible.   This information is not intended to replace advice given to you by your health care provider. Make sure you discuss any questions you have with your health care provider.   Document Released: 12/12/2005 Document Revised: 11/26/2011 Document Reviewed: 01/23/2011 Elsevier Interactive Patient Education 2016 [place]. Round Ligament Pain The round ligament is a cord of muscle and tissue that helps to support the uterus. It can become a source of pain during pregnancy if it becomes stretched or twisted as the baby grows. The pain usually begins in the second trimester of pregnancy, and it can come and go until the baby is delivered. It is not a serious problem, and it does not cause harm to the baby. Round ligament pain is usually a short, sharp, and pinching pain, but it can also be a dull, lingering, and aching pain. The pain is felt in the lower side of the abdomen or  in the groin. It usually starts deep in the groin and moves up to the outside of the hip area. Pain can occur with:  A sudden change in position.  Rolling over in bed.  Coughing or sneezing.  Physical activity. HOME CARE INSTRUCTIONS Watch your condition for any changes. Take these steps to help with your pain:  When the pain starts, relax. Then try:  Sitting down.  Flexing your knees up to your abdomen.  Lying on your side with one pillow under your abdomen and another pillow between your legs.  Sitting in a warm bath for 15-20 minutes or until the pain goes away.  Take over-the-counter and prescription medicines only as told by your health care provider.  Move slowly when you sit and stand.  Avoid long walks if they cause pain.  Stop or lessen your physical activities if they cause pain. SEEK MEDICAL CARE IF:  Your pain does not go away with treatment.  You feel pain in your back that you did not have before.  Your medicine is not helping. SEEK IMMEDIATE MEDICAL CARE IF:  You develop a fever or chills.  You develop uterine contractions.  You develop vaginal bleeding.  You develop nausea or vomiting.  You develop diarrhea.  You have pain when you urinate.   This information is not intended to replace advice given to you by your health care provider. Make sure you discuss any questions you have with your health care provider.   Document Released: 06/12/2008 Document Revised: 11/26/2011 Document Reviewed: 11/10/2014 Elsevier Interactive Patient Education [employer].

## 2016-07-27 LAB — GC/CHLAMYDIA PROBE AMP (~~LOC~~) NOT AT ARMC
CHLAMYDIA, DNA PROBE: NEGATIVE
Neisseria Gonorrhea: NEGATIVE

## 2016-08-24 ENCOUNTER — Observation Stay (HOSPITAL_COMMUNITY)
Admission: AD | Admit: 2016-08-24 | Discharge: 2016-08-29 | Disposition: A | Discharge status: Home / Self Care | Payer: 59 | Source: Ambulatory Visit | Attending: Obstetrics & Gynecology | Admitting: Obstetrics & Gynecology

## 2016-08-24 DIAGNOSIS — O133 Gestational [pregnancy-induced] hypertension without significant proteinuria, third trimester: Secondary | ICD-10-CM | POA: Diagnosis not present

## 2016-08-24 DIAGNOSIS — O24013 Pre-existing diabetes mellitus, type 1, in pregnancy, third trimester: Secondary | ICD-10-CM | POA: Insufficient documentation

## 2016-08-24 DIAGNOSIS — D6959 Other secondary thrombocytopenia: Secondary | ICD-10-CM | POA: Insufficient documentation

## 2016-08-24 DIAGNOSIS — Z9641 Presence of insulin pump (external) (internal): Secondary | ICD-10-CM | POA: Diagnosis not present

## 2016-08-24 DIAGNOSIS — O09523 Supervision of elderly multigravida, third trimester: Secondary | ICD-10-CM | POA: Insufficient documentation

## 2016-08-24 DIAGNOSIS — O1400 Mild to moderate pre-eclampsia, unspecified trimester: Secondary | ICD-10-CM | POA: Diagnosis present

## 2016-08-24 DIAGNOSIS — O99113 Other diseases of the blood and blood-forming organs and certain disorders involving the immune mechanism complicating pregnancy, third trimester: Secondary | ICD-10-CM | POA: Diagnosis not present

## 2016-08-24 DIAGNOSIS — E1065 Type 1 diabetes mellitus with hyperglycemia: Secondary | ICD-10-CM | POA: Insufficient documentation

## 2016-08-24 DIAGNOSIS — Z794 Long term (current) use of insulin: Secondary | ICD-10-CM | POA: Insufficient documentation

## 2016-08-24 DIAGNOSIS — E669 Obesity, unspecified: Secondary | ICD-10-CM

## 2016-08-24 DIAGNOSIS — O1403 Mild to moderate pre-eclampsia, third trimester: Secondary | ICD-10-CM | POA: Diagnosis not present

## 2016-08-24 DIAGNOSIS — O24113 Pre-existing diabetes mellitus, type 2, in pregnancy, third trimester: Secondary | ICD-10-CM

## 2016-08-24 DIAGNOSIS — O1490 Unspecified pre-eclampsia, unspecified trimester: Secondary | ICD-10-CM

## 2016-08-24 DIAGNOSIS — Z87891 Personal history of nicotine dependence: Secondary | ICD-10-CM | POA: Diagnosis not present

## 2016-08-24 DIAGNOSIS — Z3A31 31 weeks gestation of pregnancy: Secondary | ICD-10-CM | POA: Insufficient documentation

## 2016-08-24 LAB — GLUCOSE, CAPILLARY: GLUCOSE-CAPILLARY: 177 mg/dL — AB (ref 65–99)

## 2016-08-24 MED ORDER — DOCUSATE SODIUM 100 MG PO CAPS
100.0000 mg | ORAL_CAPSULE | Freq: Every day | ORAL | Status: DC
  Administered 2016-08-25 – 2016-08-29 (×5): 100 mg via ORAL
  Filled 2016-08-24 (×5): qty 1

## 2016-08-24 MED ORDER — ACETAMINOPHEN 325 MG PO TABS
650.0000 mg | ORAL_TABLET | ORAL | Status: DC | PRN
  Administered 2016-08-26: 650 mg via ORAL
  Filled 2016-08-24: qty 2

## 2016-08-24 MED ORDER — CALCIUM CARBONATE ANTACID 500 MG PO CHEW
2.0000 | CHEWABLE_TABLET | ORAL | Status: DC | PRN
  Administered 2016-08-27 – 2016-08-28 (×2): 400 mg via ORAL
  Filled 2016-08-24 (×2): qty 2

## 2016-08-24 MED ORDER — PRENATAL MULTIVITAMIN CH
1.0000 | ORAL_TABLET | Freq: Every day | ORAL | Status: DC
  Administered 2016-08-25 – 2016-08-29 (×5): 1 via ORAL
  Filled 2016-08-24 (×5): qty 1

## 2016-08-24 MED ORDER — ZOLPIDEM TARTRATE 5 MG PO TABS
5.0000 mg | ORAL_TABLET | Freq: Every evening | ORAL | Status: DC | PRN
  Administered 2016-08-25 – 2016-08-28 (×3): 5 mg via ORAL
  Filled 2016-08-24 (×3): qty 1

## 2016-08-24 MED ORDER — BETAMETHASONE SOD PHOS & ACET 6 (3-3) MG/ML IJ SUSP
12.0000 mg | INTRAMUSCULAR | Status: AC
  Administered 2016-08-25 (×2): 12 mg via INTRAMUSCULAR
  Filled 2016-08-24 (×2): qty 2

## 2016-08-24 NOTE — H&P (Signed)
[name] is a 40 y.o. female, [phone] at 30.5 weeks, presenting for admission after office labs reveal PreEclampsia with PC Ratio of 0.95.  Patient pregnancy and medical history also significant for IDDM.   Patient Active Problem List   Diagnosis Date Noted  . Preeclampsia 08/24/2016  . Pre-existing type 2 diabetes affecting pregnancy, antepartum 04/24/2016  . History of reversal of tubal ligation 02/20/2016  . H/O tubal ligation 02/20/2016  . Diabetes mellitus ([place]) 02/20/2016  . Vaginal bleeding 02/20/2016    History of present pregnancy: Patient entered care at 4.2 weeks.   EDC of 10/28/2016 was established by Definite LMP of 01/22/2016.   Anatomy scan:  20.2 weeks, with normal, but limited findings and an lateral right placenta.   Additional [place] evaluations:   25.4 wks for F/U Significant prenatal events: Blood glucose uncontrolled with increases in insulin regime.  Mgmt by [facility] and Dr. Buddy Duty.  Patient received insulin pump, but has not utilized as of current.  Last evaluation:  08/24/16 in office by Dr. Carmon Sails.  Pt with 2+ Protein.  OB History    Gravida Para Term Preterm AB Living   8 3 1 2 4 3    SAB TAB Ectopic Multiple Live Births   2 1 1          Past Medical History:  Diagnosis Date  . Diabetes mellitus without complication ([place])    type one  . Fibroid   . HPV in female   . PID (acute pelvic inflammatory disease)   . Vaginal Pap smear, abnormal    Past Surgical History:  Procedure Laterality Date  . carpel tunnel    . COLPOSCOPY W/ BIOPSY / CURETTAGE    . DILATION AND CURETTAGE OF UTERUS    . laporoscopy    . ROTATOR CUFF REPAIR    . TUBAL LIGATION    . tubal reversal     Family History: family history is not on file. Social History:  reports that she has quit smoking. She has quit using smokeless tobacco. She reports that she drinks alcohol. She reports that she does not use drugs.   Prenatal Transfer Tool  Maternal Diabetes: Yes:  Diabetes Type:   Pre-pregnancy Genetic Screening: Normal Maternal Ultrasounds/Referrals: Normal Fetal Ultrasounds or other Referrals:  None Maternal Substance Abuse:  No Significant Maternal Medications:  Meds include: Other:  Significant Maternal Lab Results: None    ROS:  -LoF, -VB, +FM, +BH ctx, -GI Concerns, No HA, visual disturbances, SOB, or edema   Allergies  Allergen Reactions  . Sulfa Antibiotics Anaphylaxis       Last menstrual period 01/22/2016.  Physical Exam  Vitals reviewed. Constitutional: She is oriented to person, place, and time. She appears well-developed and well-nourished. No distress.  HENT:  Head: Normocephalic and atraumatic.  Eyes: Conjunctivae are normal.  Neck: Normal range of motion.  Cardiovascular: Normal rate, regular rhythm and normal heart sounds.   Respiratory: Effort normal and breath sounds normal.  GI: Soft. Bowel sounds are normal.  Musculoskeletal: Normal range of motion. She exhibits no edema.  Neurological: She is alert and oriented to person, place, and time.  Skin: Skin is warm and dry.    FHR: 135 bpm UCs:  None  Prenatal labs: ABO, Rh: --/--/AB POS (06/05 2152) Antibody: NEG (12/09 0014) Rubella:  Immune RPR:   NR HBsAg:   Negative HIV:   NR GBS:  Unknown-Collected Sickle cell/Hgb electrophoresis:  Normal Pap:  Unknown GC:  NR Chlamydia:  NR Other:  Hgb A1C: 03/2016 8.9, 07/2016 5.9    Assessment IUP at 30.5wks Reassuring FHT PreEclampsia DM-T1 History of PTL Thrombocytopenia DO  Plan: Admit to Antepartum per consult with Dr. Chauncey Cruel. [place] Routine Antepartum Orders per [facility] Guideline BMZ Course CBGs QID, AC & HS MFM Consult for Co-mgmt Diabetes Consult Insulin ordered per previous regime-Novolin 60U at HS; 70U at [place], MSN 08/24/2016, 11:05 PM

## 2016-08-25 ENCOUNTER — Encounter (HOSPITAL_COMMUNITY): Payer: Self-pay | Admitting: General Practice

## 2016-08-25 DIAGNOSIS — O1403 Mild to moderate pre-eclampsia, third trimester: Secondary | ICD-10-CM | POA: Diagnosis not present

## 2016-08-25 LAB — CBC
HEMATOCRIT: 27 % — AB (ref 36.0–46.0)
Hemoglobin: 9.8 g/dL — ABNORMAL LOW (ref 12.0–15.0)
MCH: 30.4 pg (ref 26.0–34.0)
MCHC: 36.3 g/dL — AB (ref 30.0–36.0)
MCV: 83.9 fL (ref 78.0–100.0)
PLATELETS: 110 10*3/uL — AB (ref 150–400)
RBC: 3.22 MIL/uL — ABNORMAL LOW (ref 3.87–5.11)
RDW: 15.1 % (ref 11.5–15.5)
WBC: 11.2 10*3/uL — AB (ref 4.0–10.5)

## 2016-08-25 LAB — GLUCOSE, CAPILLARY
GLUCOSE-CAPILLARY: 173 mg/dL — AB (ref 65–99)
GLUCOSE-CAPILLARY: 156 mg/dL — AB (ref 65–99)
Glucose-Capillary: 159 mg/dL — ABNORMAL HIGH (ref 65–99)
Glucose-Capillary: 208 mg/dL — ABNORMAL HIGH (ref 65–99)

## 2016-08-25 LAB — GROUP B STREP BY PCR: GROUP B STREP BY PCR: NEGATIVE

## 2016-08-25 LAB — TYPE AND SCREEN
ABO/RH(D): AB POS
ANTIBODY SCREEN: NEGATIVE

## 2016-08-25 LAB — COMPREHENSIVE METABOLIC PANEL
ALT: 23 U/L (ref 14–54)
AST: 27 U/L (ref 15–41)
Albumin: 3 g/dL — ABNORMAL LOW (ref 3.5–5.0)
Alkaline Phosphatase: 100 U/L (ref 38–126)
Anion gap: 7 (ref 5–15)
BILIRUBIN TOTAL: 0.3 mg/dL (ref 0.3–1.2)
BUN: 9 mg/dL (ref 6–20)
CHLORIDE: 106 mmol/L (ref 101–111)
CO2: 22 mmol/L (ref 22–32)
CREATININE: 0.6 mg/dL (ref 0.44–1.00)
Calcium: 9.3 mg/dL (ref 8.9–10.3)
GFR calc Af Amer: >60 mL/min (ref >60)
GLUCOSE: 183 mg/dL — AB (ref 65–99)
POTASSIUM: 3.5 mmol/L (ref 3.5–5.1)
SODIUM: 135 mmol/L (ref 135–145)
Total Protein: 6.4 g/dL — ABNORMAL LOW (ref 6.5–8.1)

## 2016-08-25 LAB — OB RESULTS CONSOLE GBS: GBS: NEGATIVE

## 2016-08-25 LAB — LACTATE DEHYDROGENASE: LDH: 165 U/L (ref 98–192)

## 2016-08-25 LAB — PROTEIN / CREATININE RATIO, URINE
CREATININE, URINE: 213 mg/dL
Total Protein, Urine: <6 mg/dL

## 2016-08-25 LAB — URIC ACID: Uric Acid, Serum: 4 mg/dL (ref 2.3–6.6)

## 2016-08-25 MED ORDER — INSULIN ASPART 100 UNIT/ML ~~LOC~~ SOLN
0.0000 [IU] | Freq: Three times a day (TID) | SUBCUTANEOUS | Status: DC
  Administered 2016-08-25: 4 [IU] via SUBCUTANEOUS
  Administered 2016-08-26 (×2): 6 [IU] via SUBCUTANEOUS
  Administered 2016-08-26 – 2016-08-27 (×2): 4 [IU] via SUBCUTANEOUS
  Administered 2016-08-27 – 2016-08-28 (×2): 3 [IU] via SUBCUTANEOUS
  Administered 2016-08-28: 4 [IU] via SUBCUTANEOUS
  Administered 2016-08-28: 3 [IU] via SUBCUTANEOUS

## 2016-08-25 MED ORDER — INSULIN NPH (HUMAN) (ISOPHANE) 100 UNIT/ML ~~LOC~~ SUSP
60.0000 [IU] | Freq: Every day | SUBCUTANEOUS | Status: DC
  Administered 2016-08-25 – 2016-08-26 (×2): 60 [IU] via SUBCUTANEOUS

## 2016-08-25 MED ORDER — INSULIN NPH (HUMAN) (ISOPHANE) 100 UNIT/ML ~~LOC~~ SUSP
70.0000 [IU] | Freq: Every day | SUBCUTANEOUS | Status: DC
  Administered 2016-08-25 – 2016-08-27 (×3): 70 [IU] via SUBCUTANEOUS
  Filled 2016-08-25: qty 10

## 2016-08-25 MED ORDER — INSULIN ASPART 100 UNIT/ML ~~LOC~~ SOLN
30.0000 [IU] | Freq: Three times a day (TID) | SUBCUTANEOUS | Status: DC
  Administered 2016-08-25 – 2016-08-26 (×4): 30 [IU] via SUBCUTANEOUS

## 2016-08-25 NOTE — Progress Notes (Addendum)
Inpatient Diabetes Program Recommendations  AACE/ADA: New Consensus Statement on Inpatient Glycemic Control (2015)  Target Ranges:  Prepandial:   less than 140 mg/dL      Peak postprandial:   less than 180 mg/dL (1-2 hours)      Critically ill patients:  140 - 180 mg/dL   Lab Results  Component Value Date   GLUCAP 177 (H) 08/24/2016    Review of Glycemic Control  Diabetes history: DM Outpatient Diabetes medications: NPH 70 units ac breakfast + 60  units hs + Novolog 1 unit per 1 gm carbohydrate (usually approximately 30 units based on 30 gm carbohydrate) Current orders for Inpatient glycemic control: NPH 70 units ac breakfast + 60 units hs  Inpatient Diabetes Program Recommendations:   Spoke with patient by phone regarding insulin regimen for home.  Patient will need meal coverage Novolog 30 units added tid if eats 50%.  Novolog correction scale-pregnant patient order set: Correction qid (fasting 6 am, and 2 hrs postprandial) 6 am, 10 am, 2 pm, & 7 pm 91-120-   3 units 121-160- 4 units 161-200 - 6 units 201-240 - 9 units 241-280 - 12 units 281-320 - 15 units 321-360 - 18 units 361-400 - 21 units > 400      -24 units & call physician   Patient sees Dr. Buddy Duty as endocrinologist and currently pursuing Medtronic insulin pump. Insulin pump arrived in the mail yesterday so has not been able to start the pump yet.  Thank you, Nani Gasser. Zailee Vallely, RN, MSN, CDE Inpatient Glycemic Control Team Team Pager [phone] (8am-5pm) 08/25/2016 8:29 AM

## 2016-08-25 NOTE — Progress Notes (Addendum)
[name] Female, 40 y.o., 06-07-1976  30 W 6 days EGA with pre gestational DM on insulin, admitted to rule out preeclampsia   Subjective: Patient reports no complaints.  She denies headaches, scotomata, blurry vision, nausea, vomiting, abdominal pain, contractions, vaginal bleeding or leakage of fluid.  With normal gross fetal movement.    Objective: I have reviewed patient's vital signs, intake and output, medications and labs. Vitals:   08/25/16 0400 08/25/16 0600 08/25/16 0800 08/25/16 1200  BP: 123/70 122/72 131/76 (!) 141/65  Pulse: (!) 106 (!) 102 99 (!) 111  Resp: _0 Temp:   98.2 F (36.8 C) 98.6 F (37 C)  TempSrc:   Oral Oral  SpO2:   100%   Weight:      Height:        General: alert, cooperative and no distress Resp: clear to auscultation bilaterally Cardio: regular rate and rhythm, S1, S2 normal, no murmur, click, rub or gallop GI: soft, non-tender; bowel sounds normal; no masses,  no organomegaly Extremities: no edema, redness or tenderness in the calves or thighs and 1+ patellar kne reflexes NST 08/25/16: 0820am; 135 BL, mod variability, reactive.  TOCO: No contractions.   CBC    Component Value Date/Time   WBC 11.2 (H) 08/25/2016 0014   RBC 3.22 (L) 08/25/2016 0014   HGB 9.8 (L) 08/25/2016 0014   HCT 27.0 (L) 08/25/2016 0014   PLT 110 (L) 08/25/2016 0014   MCV 83.9 08/25/2016 0014   MCH 30.4 08/25/2016 0014   MCHC 36.3 (H) 08/25/2016 0014   RDW 15.1 08/25/2016 0014   CMP     Component Value Date/Time   NA 135 08/25/2016 0014   K 3.5 08/25/2016 0014   CL 106 08/25/2016 0014   CO2 22 08/25/2016 0014   GLUCOSE 183 (H) 08/25/2016 0014   BUN 9 08/25/2016 0014   CREATININE 0.60 08/25/2016 0014   CALCIUM 9.3 08/25/2016 0014   PROT 6.4 (L) 08/25/2016 0014   ALBUMIN 3.0 (L) 08/25/2016 0014   AST 27 08/25/2016 0014   ALT 23 08/25/2016 0014   ALKPHOS 100 08/25/2016 0014   BILITOT 0.3 08/25/2016 0014   GFRNONAA >60 08/25/2016 0014    GFRAA >60 08/25/2016 0014   Protein / creatinine ratio, urine 08/25/16 Order: [id number]  Status:  Final result Visible to patient:  No (Not Released) Next appt:  None   Ref Range & Units 01:26  Creatinine, Urine mg/dL 213.00   Total Protein, Urine mg/dL <6   Comments: REPEATED TO VERIFY  NO NORMAL RANGE ESTABLISHED FOR THIS TEST   Protein Creatinine Ratio 0.00 - 0.15 mg/mg      Comments: RESULT BELOW REPORTABLE RANGE,  UNABLE TO CALCULATE.   Resulting Agency  [facility]       Uric acid  Order: [id number]  Status:  Final result Visible to patient:  No (Not Released) Next appt:  None   Ref Range & Units 00:14  Uric Acid, Serum 2.3 - 6.6 mg/dL 4.0   Resulting Agency  [facility]    Specimen Collected: 08/25/16 00:14 Last Resulted: 08/25/16 00:58         Lactate dehydrogenase  Order: [id number]  Status:  Final result Visible to patient:  No (Not Released) Next appt:  None   Ref Range & Units 00:14  LDH 98 - 192 U/L 165   Resulting Agency  [facility]    Specimen Collected: 08/25/16 00:14 Last Resulted: 08/25/16 00:58  Component     Latest Ref Rng & Units 07/26/2016 08/24/2016 08/25/2016 08/25/2016           8:24 AM  2:20 PM  Glucose-Capillary     65 - 99 mg/dL 114 (H) 177 (H) 173 (H) 156 (H)  Comment 1         Notify RN   Group B strep by PCR  08/25/16 Order: [id number]  Status:  Final result Visible to patient:  No (Not Released) Next appt:  None   Ref Range & Units 01:26  Group B strep by PCR NEGATIVE NEGATIVE          Scheduled medication  . betamethasone acetate-betamethasone sodium phosphate  12 mg Intramuscular Q24H  . docusate sodium  100 mg Oral Daily  . insulin aspart  0-24 Units Subcutaneous TID WC  . insulin aspart  30 Units Subcutaneous TID WC  . insulin NPH Human  60 Units Subcutaneous QHS  . insulin NPH Human  70 Units Subcutaneous QAC breakfast  . prenatal multivitamin  1 tablet Oral Q1200    Assessment/Plan:  40 y/o at 40 W  6 days EGA with pre gestational DM on insulin, admitted to determine if she has developed preeclampsia,  -Patient has met preeclampsia criteria by at least 2 elevated BPs 4 hours apart.  Urine results however are not definitive: she had 2+ protein on urine dip in the office, Protein creatinine ratio from office labs was 0.95 but protein creatinine ratio in hospital was too low to count.  Patient to continue 24 hour urine collection until 08/26/16 at 11.35 am which will confirm definitively if with proteinuria or not  -Second betamethasone after midnight tonight, expect temporary elevation of FSGs for next few days from betamethasone effect - Diabetes management per diabetes consultation: Novolin BID, novolog coverage before and after meals  Patient has just received insulin pump, plan to have diabetes counselor help her set it up on Monday - Continue with NST Q shift  -MFM consultation ordered.    LOS: 1 day    Alinda Dooms, MD.  08/25/2016, 2:58 PM

## 2016-08-26 DIAGNOSIS — O1403 Mild to moderate pre-eclampsia, third trimester: Secondary | ICD-10-CM | POA: Diagnosis not present

## 2016-08-26 LAB — GLUCOSE, CAPILLARY
GLUCOSE-CAPILLARY: 168 mg/dL — AB (ref 65–99)
GLUCOSE-CAPILLARY: 170 mg/dL — AB (ref 65–99)
GLUCOSE-CAPILLARY: 123 mg/dL — AB (ref 65–99)
Glucose-Capillary: 168 mg/dL — ABNORMAL HIGH (ref 65–99)
Glucose-Capillary: 179 mg/dL — ABNORMAL HIGH (ref 65–99)
Glucose-Capillary: 150 mg/dL — ABNORMAL HIGH (ref 65–99)

## 2016-08-26 LAB — CREATININE CLEARANCE, URINE, 24 HOUR
COLLECTION INTERVAL-CRCL: 24 h
CREATININE 24H UR: 1482 mg/d (ref 600–1800)
CREATININE, URINE: 118.57 mg/dL
Creatinine Clearance: 172 mL/min — ABNORMAL HIGH (ref 75–115)
URINE TOTAL VOLUME-CRCL: 1250 mL

## 2016-08-26 LAB — PROTEIN, URINE, 24 HOUR
Collection Interval-UPROT: 24 hours
Protein, 24H Urine: 1350 mg/d — ABNORMAL HIGH (ref 50–100)
Protein, Urine: 108 mg/dL
Urine Total Volume-UPROT: 1250 mL

## 2016-08-26 MED ORDER — SODIUM CHLORIDE 0.9% FLUSH
3.0000 mL | Freq: Two times a day (BID) | INTRAVENOUS | Status: DC
  Administered 2016-08-26 – 2016-08-29 (×6): 3 mL via INTRAVENOUS

## 2016-08-26 NOTE — Progress Notes (Signed)
Hospital day # 2 pregnancy at [redacted]w[redacted]d with poorly controlled Type 1 diabetes and gestationnal hypertension/pre-eclampsia?  S: well, reports good fetal activity      Contractions:none      Vaginal bleeding:none now       Vaginal discharge: no significant change      Denies symptoms of pre-eclampsia  O: BP 127/76 (BP Location: Right Arm)   Pulse (!) 106   Temp 98.5 F (36.9 C) (Oral)   Resp 18   Ht 5\' 1"  (1.549 m)   Wt 194 lb 0.1 oz (88 kg)   LMP 01/22/2016   SpO2 97%   BMI 36.66 kg/m       Fetal tracings:reviewed and reassuring      Uterus gravid and non-tender      Extremities: no significant edema and no signs of DVT  A: [redacted]w[redacted]d with   1. poorly controlled Type 1 diabetes, worsened by recent BMZ, awaiting insulin pump startup tomorrow  2. Labile mild gestational hypertension, unlikely pre-eclampsia: 24 hour urine pending and awaiting MFM consultation tomorrow     stable  P: continue current plan of care  Kaylanni Ezelle A  MD 08/26/2016 12:59 PM

## 2016-08-27 ENCOUNTER — Encounter (HOSPITAL_COMMUNITY): Payer: 59

## 2016-08-27 ENCOUNTER — Inpatient Hospital Stay (HOSPITAL_COMMUNITY): Payer: 59

## 2016-08-27 ENCOUNTER — Other Ambulatory Visit (HOSPITAL_COMMUNITY): Payer: 59

## 2016-08-27 DIAGNOSIS — O1403 Mild to moderate pre-eclampsia, third trimester: Secondary | ICD-10-CM | POA: Diagnosis not present

## 2016-08-27 LAB — GLUCOSE, CAPILLARY
Glucose-Capillary: 68 mg/dL (ref 65–99)
Glucose-Capillary: 99 mg/dL (ref 65–99)
Glucose-Capillary: 76 mg/dL (ref 65–99)
Glucose-Capillary: 100 mg/dL — ABNORMAL HIGH (ref 65–99)
Glucose-Capillary: 71 mg/dL (ref 65–99)
Glucose-Capillary: 130 mg/dL — ABNORMAL HIGH (ref 65–99)
Glucose-Capillary: 131 mg/dL — ABNORMAL HIGH (ref 65–99)

## 2016-08-27 MED ORDER — PANTOPRAZOLE SODIUM 20 MG PO TBEC
20.0000 mg | DELAYED_RELEASE_TABLET | Freq: Two times a day (BID) | ORAL | Status: DC
  Administered 2016-08-27 – 2016-08-29 (×4): 20 mg via ORAL
  Filled 2016-08-27 (×6): qty 1

## 2016-08-27 MED ORDER — INSULIN ASPART 100 UNIT/ML ~~LOC~~ SOLN
15.0000 [IU] | Freq: Once | SUBCUTANEOUS | Status: AC
  Administered 2016-08-27: 15 [IU] via SUBCUTANEOUS

## 2016-08-27 MED ORDER — INSULIN NPH (HUMAN) (ISOPHANE) 100 UNIT/ML ~~LOC~~ SUSP
35.0000 [IU] | Freq: Every day | SUBCUTANEOUS | Status: DC
  Administered 2016-08-28 – 2016-08-29 (×2): 35 [IU] via SUBCUTANEOUS

## 2016-08-27 MED ORDER — INSULIN NPH (HUMAN) (ISOPHANE) 100 UNIT/ML ~~LOC~~ SUSP
30.0000 [IU] | Freq: Every day | SUBCUTANEOUS | Status: DC
  Administered 2016-08-27 – 2016-08-28 (×2): 30 [IU] via SUBCUTANEOUS

## 2016-08-27 MED ORDER — INSULIN ASPART 100 UNIT/ML ~~LOC~~ SOLN
10.0000 [IU] | Freq: Three times a day (TID) | SUBCUTANEOUS | Status: DC
  Administered 2016-08-27 – 2016-08-29 (×7): 10 [IU] via SUBCUTANEOUS

## 2016-08-27 MED ORDER — GI COCKTAIL ~~LOC~~
30.0000 mL | Freq: Once | ORAL | Status: AC
  Administered 2016-08-27: 30 mL via ORAL
  Filled 2016-08-27: qty 30

## 2016-08-27 NOTE — Progress Notes (Signed)
Initial Nutrition Assessment  DOCUMENTATION CODES:   Obesity unspecified  INTERVENTION:  CHO modified gestational diabetic diet Double protein portions  NUTRITION DIAGNOSIS:  Increased nutrient needs related to  (pregnancy and fetal growth requirements) as evidenced by  (31 weeks IUP).  GOAL:   Patient will meet greater than or equal to 90% of their needs  MONITOR:     REASON FOR ASSESSMENT:  Antenatal, Gestational Diabetes   ASSESSMENT:  31 1/7 weeks, adm with DM and r/o preeclampsia. DM II prior to pregnancy. Pre-preg weight 168 lbs. BMI 31.8. 26 lb weight gain. Reports good tolerance of diet. Reports she has understanding of how to count her CHO's   Diet Order:  Diet gestational carb mod Room service appropriate? Yes; Fluid consistency: Thin  Skin:  Reviewed, no issues  Height:   Ht Readings from Last 1 Encounters:  08/24/16 5\' 1"  (1.549 m)    Weight:   Wt Readings from Last 1 Encounters:  08/24/16 194 lb 0.1 oz (88 kg)    Ideal Body Weight:     BMI:  Body mass index is 36.66 kg/m.  Estimated Nutritional Needs:   Kcal:  1900-2100  Protein:  85-95 g  Fluid:  2.2 L  EDUCATION NEEDS:   No education needs identified at this time (Has had two sessions this preg with CDE)  Weyman Rodney M.Fredderick Severance LDN Neonatal Nutrition Support Specialist/RD III Pager [phone]      Phone [phone]

## 2016-08-27 NOTE — Progress Notes (Signed)
Pt continues to c/o upset stomach/heartburn. No results with Tums.  Will notify MD for further orders.

## 2016-08-27 NOTE — Progress Notes (Signed)
Pt given snack, encouraged to order & eat lunch.

## 2016-08-27 NOTE — Progress Notes (Signed)
Patient ID: [name], female   DOB: 12/21/1975, 40 y.o.   MRN: [id number]  Pt without complaints.  No leakage of fluid or VB.  Good FM.  No HA, BLURRED VISION OR RUQ PAIN  BP (!) 100/56 (BP Location: Right Arm)   Pulse 99   Temp 98.8 F (37.1 C) (Oral)   Resp 20   Ht 5' 1"  (1.549 m)   Wt 194 lb 0.1 oz (88 kg)   LMP 01/22/2016   SpO2 100%   BMI 36.66 kg/m   FHTS 130 REACTIVE   Toco none  Pt in NAD CV RRR Lungs CTAB abd  Gravid soft and NT GU no vb EXt no calf tenderness Results for orders placed or performed during the hospital encounter of 08/24/16 (from the past 72 hour(s))  Glucose, capillary     Status: Abnormal   Collection Time: 08/24/16 11:49 PM  Result Value Ref Range   Glucose-Capillary 177 (H) 65 - 99 mg/dL  OB RESULT CONSOLE Group B Strep     Status: None   Collection Time: 08/25/16 12:00 AM  Result Value Ref Range   GBS Negative   Type and screen [place]     Status: None   Collection Time: 08/25/16 12:14 AM  Result Value Ref Range   ABO/RH(D) AB POS    Antibody Screen NEG    Sample Expiration 08/28/2016   Lactate dehydrogenase     Status: None   Collection Time: 08/25/16 12:14 AM  Result Value Ref Range   LDH 165 98 - 192 U/L  Uric acid     Status: None   Collection Time: 08/25/16 12:14 AM  Result Value Ref Range   Uric Acid, Serum 4.0 2.3 - 6.6 mg/dL  Comprehensive metabolic panel     Status: Abnormal   Collection Time: 08/25/16 12:14 AM  Result Value Ref Range   Sodium 135 135 - 145 mmol/L   Potassium 3.5 3.5 - 5.1 mmol/L   Chloride 106 101 - 111 mmol/L   CO2 22 22 - 32 mmol/L   Glucose, Bld 183 (H) 65 - 99 mg/dL   BUN 9 6 - 20 mg/dL   Creatinine, Ser 0.60 0.44 - 1.00 mg/dL   Calcium 9.3 8.9 - 10.3 mg/dL   Total Protein 6.4 (L) 6.5 - 8.1 g/dL   Albumin 3.0 (L) 3.5 - 5.0 g/dL   AST 27 15 - 41 U/L   ALT 23 14 - 54 U/L   Alkaline Phosphatase 100 38 - 126 U/L   Total Bilirubin 0.3 0.3 - 1.2 mg/dL   GFR calc non  Af Amer >60 >60 mL/min   GFR calc Af Amer >60 >60 mL/min    Comment: (NOTE) The eGFR has been calculated using the CKD EPI equation. This calculation has not been validated in all clinical situations. eGFR's persistently <60 mL/min signify possible Chronic Kidney Disease.    Anion gap 7 5 - 15  CBC on admission     Status: Abnormal   Collection Time: 08/25/16 12:14 AM  Result Value Ref Range   WBC 11.2 (H) 4.0 - 10.5 K/uL   RBC 3.22 (L) 3.87 - 5.11 MIL/uL   Hemoglobin 9.8 (L) 12.0 - 15.0 g/dL   HCT 27.0 (L) 36.0 - 46.0 %   MCV 83.9 78.0 - 100.0 fL   MCH 30.4 26.0 - 34.0 pg   MCHC 36.3 (H) 30.0 - 36.0 g/dL   RDW 15.1 11.5 - 15.5 %  Platelets 110 (L) 150 - 400 K/uL    Comment: SPECIMEN CHECKED FOR CLOTS REPEATED TO VERIFY PLATELET COUNT CONFIRMED BY SMEAR   Protein / creatinine ratio, urine     Status: None   Collection Time: 08/25/16  1:26 AM  Result Value Ref Range   Creatinine, Urine 213.00 mg/dL   Total Protein, Urine <6 mg/dL    Comment: REPEATED TO VERIFY NO NORMAL RANGE ESTABLISHED FOR THIS TEST    Protein Creatinine Ratio        0.00 - 0.15 mg/mg[Cre]    Comment: RESULT BELOW REPORTABLE RANGE, UNABLE TO CALCULATE.   Group B strep by PCR     Status: None   Collection Time: 08/25/16  1:26 AM  Result Value Ref Range   Group B strep by PCR NEGATIVE NEGATIVE  Glucose, capillary     Status: Abnormal   Collection Time: 08/25/16  8:24 AM  Result Value Ref Range   Glucose-Capillary 173 (H) 65 - 99 mg/dL  Creatinine clearance, urine, 24 hour     Status: Abnormal   Collection Time: 08/25/16 11:45 AM  Result Value Ref Range   Urine Total Volume-CRCL 1,250 mL   Collection Interval-CRCL 24 hours   Creatinine, Urine 118.57 mg/dL   Creatinine, 24H Ur 1,482 600 - 1,800 mg/day   Creatinine Clearance 172 (H) 75 - 115 mL/min    Comment: Performed at [facility]  Protein, urine, 24 hour     Status: Abnormal   Collection Time: 08/25/16 11:45 AM  Result Value Ref Range    Urine Total Volume-UPROT 1,250 mL   Collection Interval-UPROT 24 hours   Protein, Urine 108 mg/dL   Protein, 24H Urine 1,350 (H) 50 - 100 mg/day    Comment: Performed at [facility]  Glucose, capillary     Status: Abnormal   Collection Time: 08/25/16  2:20 PM  Result Value Ref Range   Glucose-Capillary 156 (H) 65 - 99 mg/dL   Comment 1 Notify RN   Glucose, capillary     Status: Abnormal   Collection Time: 08/25/16  4:40 PM  Result Value Ref Range   Glucose-Capillary 159 (H) 65 - 99 mg/dL  Glucose, capillary     Status: Abnormal   Collection Time: 08/25/16 11:12 PM  Result Value Ref Range   Glucose-Capillary 208 (H) 65 - 99 mg/dL  Glucose, capillary     Status: Abnormal   Collection Time: 08/26/16 10:13 AM  Result Value Ref Range   Glucose-Capillary 168 (H) 65 - 99 mg/dL  Glucose, capillary     Status: Abnormal   Collection Time: 08/26/16 12:30 PM  Result Value Ref Range   Glucose-Capillary 168 (H) 65 - 99 mg/dL  Glucose, capillary     Status: Abnormal   Collection Time: 08/26/16  1:50 PM  Result Value Ref Range   Glucose-Capillary 170 (H) 65 - 99 mg/dL  Glucose, capillary     Status: Abnormal   Collection Time: 08/26/16  4:24 PM  Result Value Ref Range   Glucose-Capillary 179 (H) 65 - 99 mg/dL   Comment 1 Document in Chart   Glucose, capillary     Status: Abnormal   Collection Time: 08/26/16  6:42 PM  Result Value Ref Range   Glucose-Capillary 123 (H) 65 - 99 mg/dL   Comment 1 Notify RN   Glucose, capillary     Status: Abnormal   Collection Time: 08/26/16  9:04 PM  Result Value Ref Range   Glucose-Capillary 150 (H) 65 -  99 mg/dL  Glucose, capillary     Status: None   Collection Time: 08/27/16  8:37 AM  Result Value Ref Range   Glucose-Capillary 68 65 - 99 mg/dL  Glucose, capillary     Status: None   Collection Time: 08/27/16 11:32 AM  Result Value Ref Range   Glucose-Capillary 99 65 - 99 mg/dL   Comment 1 Notify RN    Comment 2 Document in Chart    Glucose, capillary     Status: None   Collection Time: 08/27/16  1:02 PM  Result Value Ref Range   Glucose-Capillary 76 65 - 99 mg/dL   Comment 1 Notify RN    Comment 2 Document in Chart   Glucose, capillary     Status: Abnormal   Collection Time: 08/27/16  2:08 PM  Result Value Ref Range   Glucose-Capillary 100 (H) 65 - 99 mg/dL    Assessment and Plan [redacted]w[redacted]d TYPE 1 DM AND PREECLAMPSIA WITHOUT SEVERE FEATURES BS ARE LABILE.  DECREASE DOSAGES TO 10 NOVALOG THREE TIMES A DAY WITH MEALS 30 UNITS OF NPH AT BED TIME, 35 UNITS NPH IN THE MORNING.  WILL CONTINUE SS  FETAL STATUS IS REASSURING  AWAITING MFM CONSULT

## 2016-08-27 NOTE — Progress Notes (Signed)
RN spoke to M. Felton Clinton, RN, Diabetes Educator, regarding pt needing education requiring insulin pump setup and use.  RN told pt needs to f/u with [facility] after discharge regarding Insulin Pump education & setup secondary [facility] don not manage or provide info about the pump.

## 2016-08-28 ENCOUNTER — Inpatient Hospital Stay (HOSPITAL_COMMUNITY): Payer: 59

## 2016-08-28 ENCOUNTER — Encounter (HOSPITAL_COMMUNITY): Payer: Self-pay | Admitting: Obstetrics and Gynecology

## 2016-08-28 DIAGNOSIS — O1403 Mild to moderate pre-eclampsia, third trimester: Secondary | ICD-10-CM | POA: Diagnosis not present

## 2016-08-28 LAB — COMPREHENSIVE METABOLIC PANEL
ALT: 22 U/L (ref 14–54)
AST: 22 U/L (ref 15–41)
Albumin: 2.8 g/dL — ABNORMAL LOW (ref 3.5–5.0)
Alkaline Phosphatase: 85 U/L (ref 38–126)
Anion gap: 6 (ref 5–15)
BUN: 12 mg/dL (ref 6–20)
CHLORIDE: 106 mmol/L (ref 101–111)
CO2: 22 mmol/L (ref 22–32)
CREATININE: 0.56 mg/dL (ref 0.44–1.00)
Calcium: 8.7 mg/dL — ABNORMAL LOW (ref 8.9–10.3)
Glucose, Bld: 115 mg/dL — ABNORMAL HIGH (ref 65–99)
POTASSIUM: 3.6 mmol/L (ref 3.5–5.1)
SODIUM: 134 mmol/L — AB (ref 135–145)
Total Bilirubin: 0.2 mg/dL — ABNORMAL LOW (ref 0.3–1.2)
Total Protein: 6 g/dL — ABNORMAL LOW (ref 6.5–8.1)

## 2016-08-28 LAB — GLUCOSE, CAPILLARY
GLUCOSE-CAPILLARY: 79 mg/dL (ref 65–99)
GLUCOSE-CAPILLARY: 108 mg/dL — AB (ref 65–99)
GLUCOSE-CAPILLARY: 102 mg/dL — AB (ref 65–99)
Glucose-Capillary: 75 mg/dL (ref 65–99)
Glucose-Capillary: 97 mg/dL (ref 65–99)
Glucose-Capillary: 154 mg/dL — ABNORMAL HIGH (ref 65–99)

## 2016-08-28 LAB — CBC
HCT: 24 % — ABNORMAL LOW (ref 36.0–46.0)
Hemoglobin: 8.5 g/dL — ABNORMAL LOW (ref 12.0–15.0)
MCH: 30.5 pg (ref 26.0–34.0)
MCHC: 35.4 g/dL (ref 30.0–36.0)
MCV: 86 fL (ref 78.0–100.0)
PLATELETS: 101 10*3/uL — AB (ref 150–400)
RBC: 2.79 MIL/uL — AB (ref 3.87–5.11)
RDW: 15.2 % (ref 11.5–15.5)
WBC: 11.6 10*3/uL — AB (ref 4.0–10.5)

## 2016-08-28 LAB — TYPE AND SCREEN
ABO/RH(D): AB POS
Antibody Screen: NEGATIVE

## 2016-08-28 NOTE — Progress Notes (Signed)
Patient ID: AFREEN FAUROT, female   DOB: [birth year], 40 y.o.   MRN: [id number] ANTONIETTE GUYETT is a 40 y.o. [id number] at [redacted]w[redacted]d [id number]}  Subjective: Mild HA that pt thinks is from needing to eat.  Pt instructed to notify RN if still present after eating.  Good FM, no VB and no CTXs.  Objective: BP (!) 100/51 (BP Location: Left Arm)   Pulse 94   Temp 98.1 F (36.7 C) (Oral)   Resp 18   Ht 5\' 1"  (1.549 m)   Wt 194 lb 0.1 oz (88 kg)   LMP 01/22/2016   SpO2 100%   BMI 36.66 kg/m  I/O last 3 completed shifts: In: 3 [I.V.:3] Out: -  Total I/O In: 3 [I.V.:3] Out: -   Physical Exam:  Gen: alert Chest/Lungs: cta bilaterally  Heart/Pulse: RRR  Abdomen: soft, gravid, nontender Uterine fundus: soft, nontender Skin & Color: warm and dry  EXT: no calf tenderness  FHT:  FHR: 130s-140s bpm, variability: moderate,  accelerations:  Present,  decelerations:  Absent UC:   none SVE:    deferred  Labs: Lab Results  Component Value Date   WBC 11.2 (H) 08/25/2016   HGB 9.8 (L) 08/25/2016   HCT 27.0 (L) 08/25/2016   MCV 83.9 08/25/2016   PLT 110 (L) 08/25/2016    Assessment and Plan: has History of reversal of tubal ligation; H/O tubal ligation; Diabetes mellitus ([place]); Vaginal bleeding; Pre-existing type 2 diabetes affecting pregnancy, antepartum; and Preeclampsia on her problem list.  T1DM - has insulin pump pending and plans to arrange appt for Thurs to have it placed and instructions for use Pt seen by MFM today and recs given.  Fetus is AGA with nl fluid.  BPs under good control on bedrest.  Dr. Jacinto Reap feels pt may be foolowed as an outpatient.  Will plan discharge tomorrow.  I have called the office and asked them to arrange BPP weekly on Fridays with a visit and NST with urine dip and BP check on Tuesdays.  She will also need weekly labs. (DrNeuman - if BP and platelets are stable I would be comfortable with outpatient management with the following interventions -twice weekly  visits for BP check -antepartum testing as per your preference, either weekly BPP or twice-weekly NSTs -weekly platelets and LFTs. Further quantitative protienuria evaluation is probably unnecessary) Fetal status is reassuring   Wandy Bossler Y 08/28/2016, 3:31 PM

## 2016-08-28 NOTE — Progress Notes (Signed)
Maternal Fetal Medicine Consultation  Requesting Provider(s):[place]  Primary Ob: Rivard Reason for consultation:R/O preeclampsia in the face of type 2 DM HPI: [name] is a 40 y.o. female, P1243 at 31+2 weeks with longstanding type 2 DM with improved control over the course of the pregnancy. She saw MFM for an initial consultation 04/24/16 and at that time had a HgbA1C c.9%. Her most recent is 5.9%. She has been trying to get on an insulin pump for several months and has just recently gotten it through her endocrinologist. She is awaiting instruction on it's use. She had a normal fetal echocardiogram through [facility] pediatric cardiology. She had normal baseline preeclampsia labs in early pregnancy, with a 24 hour protein collection of 110mg . She was admitted from the office on 12/8 after noted elevated protein on urine dipstick and PC ratio was elevated at 0.95. She had several elevated BP during this admission, but the majority and most recent have been well within normal range. LFTs are normal. Platelets are slightly low at 110k, however initial baseline platelets were borderline at 159k. She has no PIH symptoms whatsoever. She has received betamethasone, and subsequently has had elevated blood glucose levels in the 170s. He usual levels are fastings in the 90's and 2 hour postprandials in the 120s to 140s. This is consistent with her HgbA1C results. She complains of fairly frequent hypoglycemic episodes overnight requiring late-night eating that elevates her FBS  OB History: OB History    Gravida Para Term Preterm AB Living   8 3 1 2 4 3    SAB TAB Ectopic Multiple Live Births   2 1 1           PMH:  Past Medical History:  Diagnosis Date  . Diabetes mellitus without complication ([place])    type one  . Fibroid   . HPV in female   . PID (acute pelvic inflammatory disease)   . Vaginal Pap smear, abnormal     PSH:  Past Surgical History:  Procedure Laterality Date  . carpel tunnel     . COLPOSCOPY W/ BIOPSY / CURETTAGE    . DILATION AND CURETTAGE OF UTERUS    . laporoscopy    . ROTATOR CUFF REPAIR    . TUBAL LIGATION    . tubal reversal     Meds: See EPIC section Allergies: Sulfa Fh: See EPIC section Soc: See EPIC section  Review of Systems: NO PIH symptoms reported  PE: See EPIC vitals and PE  Please see separate document for fetal ultrasound report.  A/P:  1. Type 2 Dm. I am not particularly worried about her current elevations as these are most likely steroid effect. I agree with her planned initiation of pump therapy through Dr. Cindra Eves office. I have no additional recommendations at this time  2. Probable preeclampsia. Given the significant increase in proteinuria from 110mg  to 1350mg  and some minor BP evaluations, I am fairly sure we are dealing with early and mild preeclampsia. US shows normal growth, fluid and an 8/8 BPP. I would make the following recommendations: A) continue inpatient observation for BP for another 12-24 hours to make sure she has no severe-range Bp. I would recheck platelets ASAP to make sure they are stable. B) if BP and platelets are stable I would be comfortable with outpatient management with the following interventions -twice weekly visits for BP check -antepartum testing as per your preference, either weekly BPP or twice-weekly NSTs -weekly platelets and LFTs. Further quantitative protienuria evaluation is probably  unnecessary  The patient has AMA. I do not have access to her full prenatals, I would make sure that her risk for aneuploidy was addressed with her.  Thank you for allowing [place] to participate in [name]'s care. Please feel free to contact us for any questions or concerns.       Thank you for the opportunity to be a part of the care of [name]. Please contact our office if we can be of further assistance.   I spent approximately 30 minutes with this patient with over 50% of time spent in  face-to-face counseling.

## 2016-08-28 NOTE — Progress Notes (Signed)
Pt noted have a few ctxs, states she feels some tightening but not painful.  Pt instructed if ctxs get stronger and more frequent to notify RN.  Pt verbalized understanding and agreeable.

## 2016-08-29 DIAGNOSIS — O1403 Mild to moderate pre-eclampsia, third trimester: Secondary | ICD-10-CM | POA: Diagnosis not present

## 2016-08-29 DIAGNOSIS — O1400 Mild to moderate pre-eclampsia, unspecified trimester: Secondary | ICD-10-CM | POA: Diagnosis present

## 2016-08-29 LAB — CBC
HEMATOCRIT: 25.7 % — AB (ref 36.0–46.0)
HEMOGLOBIN: 8.9 g/dL — AB (ref 12.0–15.0)
MCH: 29.8 pg (ref 26.0–34.0)
MCHC: 34.6 g/dL (ref 30.0–36.0)
MCV: 86 fL (ref 78.0–100.0)
Platelets: 111 10*3/uL — ABNORMAL LOW (ref 150–400)
RBC: 2.99 MIL/uL — AB (ref 3.87–5.11)
RDW: 15.5 % (ref 11.5–15.5)
WBC: 13.7 10*3/uL — AB (ref 4.0–10.5)

## 2016-08-29 LAB — GLUCOSE, CAPILLARY
GLUCOSE-CAPILLARY: 95 mg/dL (ref 65–99)
Glucose-Capillary: 75 mg/dL (ref 65–99)
Glucose-Capillary: 90 mg/dL (ref 65–99)

## 2016-08-29 NOTE — Discharge Summary (Signed)
OB Discharge Summary     Patient Name: [name] DOB: September 01, 1976 MRN: [id number]  Date of admission: 08/24/2016  Date of discharge: 08/29/2016  Admitting diagnosis: 30w 5 d EGA direct admit, preeclampsia.    Pregestational diabetes mellitus (type 1) on insulin    Intrauterine pregnancy: [redacted]w[redacted]d     Secondary diagnosis:  Active Problems:   Preeclampsia   Mild preeclampsia  Pregestational diabetes mellitus (type 1) on insulin  Gestational thrombocytopenia  Additional problems:  Gestational thrombocytopenia     Discharge diagnosis:  [redacted]w[redacted]d EGA IUP    Preeclampsia  Mild preeclampsia  Pregestational diabetes mellitus (type 1) on insulin   Gestational thrombocytopenia                                                                                               Complications: None  Hospital course:  40 y/o P1234 who was admitted at 30 weeks 5 days EGA to rule out preeclampsia after an office visit in which proteinuria was noted and preeclampsia labs came back abnormal.  In the hospital she ruled in for preeclampsia without severe features by elevated blood pressures in the mild range and 24 hr protein of 1.3 grams.  Her platelets were noted to be low but stabilized with serial checks.  She received betamethasone for fetal lung maturity.  Her FSGs were initially elevated after betamethasone but improved over time with her insulin regimen. She remained stable and was deemed stable for discharge for continued outpatient management.  She did not have any complaints at the time of discharge.  Strict preeclampsia, fetal kick counts and diabetes precautions were reviewed with the patient at discharge.     Consultations:  Diabetes education Nutritional management  Physical exam  Vitals:   08/29/16 0900 08/29/16 0906 08/29/16 1208 08/29/16 1610  BP: 120/72 120/72 130/64 121/64  Pulse: 93 92 91 91  Resp: 18  18 18   Temp: 98.3 F (36.8 C)  98.3 F (36.8 C) 98.1 F (36.7 C)   TempSrc: Oral  Oral Oral  SpO2: 100% 99% 99% 99%  Weight:      Height:       General: alert, cooperative and no distress CVS: S1, s2, RRR Pulm: CTAB Abdomen:  Soft, non tender, gravid Extremities: warm and well perfused, no edema, no calf tenderness. EFM: Category 1 tracing.  TOCO: No contractions.   Labs: Lab Results  Component Value Date   WBC 13.7 (H) 08/29/2016   HGB 8.9 (L) 08/29/2016   HCT 25.7 (L) 08/29/2016   MCV 86.0 08/29/2016   PLT 111 (L) 08/29/2016   CMP Latest Ref Rng & Units 08/28/2016  Glucose 65 - 99 mg/dL 115(H)  BUN 6 - 20 mg/dL 12  Creatinine 0.44 - 1.00 mg/dL 0.56  Sodium 135 - 145 mmol/L 134(L)  Potassium 3.5 - 5.1 mmol/L 3.6  Chloride 101 - 111 mmol/L 106  CO2 22 - 32 mmol/L 22  Calcium 8.9 - 10.3 mg/dL 8.7(L)  Total Protein 6.5 - 8.1 g/dL 6.0(L)  Total Bilirubin 0.3 - 1.2 mg/dL 0.2(L)  Alkaline Phos 38 - 126 U/L 85  AST 15 - 41 U/L 22  ALT 14 - 54 U/L 22   CBC Latest Ref Rng & Units 08/29/2016 08/28/2016 08/25/2016  WBC 4.0 - 10.5 K/uL 13.7(H) 11.6(H) 11.2(H)  Hemoglobin 12.0 - 15.0 g/dL 8.9(L) 8.5(L) 9.8(L)  Hematocrit 36.0 - 46.0 % 25.7(L) 24.0(L) 27.0(L)  Platelets 150 - 400 K/uL 111(L) 101(L) 110(L)    Discharge instruction: per After Visit Summary   After visit meds:    Medication List    STOP taking these medications   cyclobenzaprine 10 MG tablet Commonly known as:  FLEXERIL   diphenhydrAMINE 25 MG tablet Commonly known as:  BENADRYL     TAKE these medications   NOVOLIN N 100 UNIT/ML injection Generic drug:  insulin NPH Human 35 units in the morning and 30 units at night   NOVOLOG FLEXPEN 100 UNIT/ML FlexPen Generic drug:  insulin aspart Inject 1-30 Units into the skin 3 (three) times daily with meals. Sliding scale (has been using 10 units novolog before meals and sliding scale after meals)   prenatal multivitamin Tabs tablet Take 1 tablet by mouth daily at 12 noon.     Diet:  carb modified diet  Activity:  Advance as tolerated. Pelvic rest for 6 weeks.   Outpatient follow up:2 days: Friday 08/31/16.  at [facility] office for Encamp[place] visit, BPP as well as preeclampsia labs.  Patient to call diabetes education ASAP to get instructions on how to use insulin pump  Disposition:To home.    08/29/2016 Alinda Dooms, MD

## 2016-08-29 NOTE — Progress Notes (Addendum)
Antepartum LOS: 5 [name], 40 y.o.,   OB History    Gravida Para Term Preterm AB Living   8 3 1 2 4 3    SAB TAB Ectopic Multiple Live Births   2 1 1           Subjective -Patient asleep, but easily awakened.  Reports contractions throughout the night, but able to sleep.  Denies VB and endorses fetal movement.    Objective  Vitals:   08/28/16 1140 08/28/16 1544 08/28/16 1926 08/28/16 2329  BP: (!) 100/51 (!) 117/58 129/62 121/69  Pulse: 94 96 90 88  Resp: 18 20 18 18   Temp: 98.1 F (36.7 C) 98.4 F (36.9 C) 98 F (36.7 C) 98.4 F (36.9 C)  TempSrc: Oral Oral Oral Oral  SpO2: 100% 97%    Weight:      Height:        Results for orders placed or performed during the hospital encounter of 08/24/16 (from the past 24 hour(s))  Glucose, capillary     Status: None   Collection Time: 08/28/16  8:39 AM  Result Value Ref Range   Glucose-Capillary 75 65 - 99 mg/dL   Comment 1 Notify RN    Comment 2 Document in Chart   Glucose, capillary     Status: None   Collection Time: 08/28/16 11:36 AM  Result Value Ref Range   Glucose-Capillary 97 65 - 99 mg/dL   Comment 1 Notify RN    Comment 2 Document in Chart   Glucose, capillary     Status: None   Collection Time: 08/28/16  2:54 PM  Result Value Ref Range   Glucose-Capillary 79 65 - 99 mg/dL   Comment 1 Notify RN    Comment 2 Document in Chart   CBC     Status: Abnormal   Collection Time: 08/28/16  4:03 PM  Result Value Ref Range   WBC 11.6 (H) 4.0 - 10.5 K/uL   RBC 2.79 (L) 3.87 - 5.11 MIL/uL   Hemoglobin 8.5 (L) 12.0 - 15.0 g/dL   HCT 24.0 (L) 36.0 - 46.0 %   MCV 86.0 78.0 - 100.0 fL   MCH 30.5 26.0 - 34.0 pg   MCHC 35.4 30.0 - 36.0 g/dL   RDW 15.2 11.5 - 15.5 %   Platelets 101 (L) 150 - 400 K/uL  Comprehensive metabolic panel     Status: Abnormal   Collection Time: 08/28/16  4:03 PM  Result Value Ref Range   Sodium 134 (L) 135 - 145 mmol/L   Potassium 3.6 3.5 - 5.1 mmol/L   Chloride 106 101 - 111 mmol/L   CO2 22 22 - 32 mmol/L   Glucose, Bld 115 (H) 65 - 99 mg/dL   BUN 12 6 - 20 mg/dL   Creatinine, Ser 0.56 0.44 - 1.00 mg/dL   Calcium 8.7 (L) 8.9 - 10.3 mg/dL   Total Protein 6.0 (L) 6.5 - 8.1 g/dL   Albumin 2.8 (L) 3.5 - 5.0 g/dL   AST 22 15 - 41 U/L   ALT 22 14 - 54 U/L   Alkaline Phosphatase 85 38 - 126 U/L   Total Bilirubin 0.2 (L) 0.3 - 1.2 mg/dL   GFR calc non Af Amer >60 >60 mL/min   GFR calc Af Amer >60 >60 mL/min   Anion gap 6 5 - 15  Glucose, capillary     Status: Abnormal   Collection Time: 08/28/16  5:10 PM  Result Value Ref Range  Glucose-Capillary 108 (H) 65 - 99 mg/dL   Comment 1 Notify RN    Comment 2 Document in Chart   Glucose, capillary     Status: Abnormal   Collection Time: 08/28/16  6:40 PM  Result Value Ref Range   Glucose-Capillary 102 (H) 65 - 99 mg/dL   Comment 1 Notify RN    Comment 2 Document in Chart   Glucose, capillary     Status: Abnormal   Collection Time: 08/28/16  8:50 PM  Result Value Ref Range   Glucose-Capillary 154 (H) 65 - 99 mg/dL    Meds: Scheduled Meds: . docusate sodium  100 mg Oral Daily  . insulin aspart  0-24 Units Subcutaneous TID WC  . insulin aspart  10 Units Subcutaneous TID WC  . insulin NPH Human  30 Units Subcutaneous QHS  . insulin NPH Human  35 Units Subcutaneous QAC breakfast  . pantoprazole  20 mg Oral BID  . prenatal multivitamin  1 tablet Oral Q1200  . sodium chloride flush  3 mL Intravenous Q12H   Continuous Infusions: PRN Meds:.acetaminophen, calcium carbonate, zolpidem   Physical Exam  Constitutional: She appears well-developed and well-nourished.  HENT:  Head: Normocephalic and atraumatic.  Eyes: Conjunctivae are normal.  Neck: Normal range of motion.  Cardiovascular: Normal rate, regular rhythm and normal heart sounds.   Pulmonary/Chest: Effort normal and breath sounds normal.  Abdominal: Soft. Bowel sounds are normal.  Musculoskeletal: Normal range of motion.  Neurological: She is alert.  Skin:  Skin is warm and dry.  Psychiatric: She has a normal mood and affect.  Vitals reviewed. :   Monitoring Type:NST Time:2300 FHR: 135 bpm, Mod Var, -Decels, +Accels UC: None graphed  Assessment IUP at [redacted]w[redacted]d Reassuring FHT DM-T2 PreEclampsia-Mild Thrombocytopenia  Plan Dr. Gillermo Murdoch consulted and advised hold discharge at current Patient without q/c Continue current plan of care Upcoming Treatments/Tests: Urine Culture in Process Dr.EK to follow as appropriate  Maryann Conners, MSN, CNM 08/29/2016, 6:37 AM   I saw and examined patient and agree with above findings, assessment and plan as per [facility]. NST at 9 AM today: 140 BL, mod variability, reactive.  TOCO:  No contractions.  Plan is to repeat CBC today at 4pm, if platelets remain stable may discharge to home with office follow up, otherwise patient will have to stay longer in hospital.  I discussed with patient this plan and she agrees.  We also reviewed preeclampsia and preterm labor precautions, importance of modified bed rest at home and importance of office follow up as scheduled.    Dr. Alesia Richards. 08/29/2016 1248.

## 2016-08-29 NOTE — Progress Notes (Signed)
Dr. Alesia Richards called as this RN was off the unit to inform that changes had been made to the discharge instructions after they were printed and given to the patient. RN returned to floor and patient had left to go home. Called patient to inform and patient requested info be sent to her via email. New sliding scale coverage and specific insulin dose instructions sent via email with no patient identifiers. Email to patient also instructed her to call the office with questions. Dr. Alesia Richards notified.

## 2016-08-29 NOTE — Discharge Instructions (Signed)
°  Ms. [name], -Continue checking your sugars as before, fastings, before meals and two hours after meals. -Continue taking your insulin as before: Novolog 10 units before each meal (three times a day), NPH (Novolin) 30 units at bedtime, NPH (Novolin) 35 units before breakfast and novolog coverage after meals as instructed by the diabetes educator and Dr. Buddy Duty.  -Please follow up with Diabetes education for instructions on insulin pump use as soon as possible.   -Please keep your upcoming appointments in the office as scheduled.   Dr. Alesia Richards.  [facility]: Phone. [phone]

## 2016-08-30 LAB — URINE CULTURE: CULTURE: NO GROWTH

## 2016-09-17 NOTE — L&D Delivery Note (Signed)
Delivery Note At  430 pm a viable female "[name]"was delivered via  (LOA: ).  APGAR:9-9 ,  weight  .  pending Placenta status: intact.  Cord:3 vessels  with the following complications: none  Anesthesia:  Epidural Episiotomy:  none Lacerations:  none Suture Repair:  Est. Blood Loss (mL):  300 Cytotec 872mcg given per rectum prophylactic Mom to postpartum.  Baby to Couplet care / Skin to Skin. Mother is requesting Franklin A Clemmons CNM 10/07/2016, 4:51 PM

## 2016-09-20 ENCOUNTER — Encounter: Payer: 59 | Attending: Advanced Practice Midwife | Admitting: *Deleted

## 2016-09-20 DIAGNOSIS — Z713 Dietary counseling and surveillance: Secondary | ICD-10-CM | POA: Insufficient documentation

## 2016-09-20 DIAGNOSIS — E119 Type 2 diabetes mellitus without complications: Secondary | ICD-10-CM | POA: Insufficient documentation

## 2016-09-20 DIAGNOSIS — O24119 Pre-existing diabetes mellitus, type 2, in pregnancy, unspecified trimester: Secondary | ICD-10-CM

## 2016-09-20 NOTE — Patient Instructions (Signed)
Plan:    Set up [facility] account today,   Text or call me within 24 hours with updated BG info,   Upload to [facility] by Monday (4 days) and   Come back for follow up visit in 1 week for 2nd [facility] review and pump follow up visit.

## 2016-09-20 NOTE — Progress Notes (Signed)
Insulin Pump Start Progress Note on 09/20/2016  Patient appointment start time: 0845  End time 1000  Patient here for insulin pump start on Medtronic 630G pump and Quick set 9 mm infusion set Orders with pump settings received from MD Patient completed Pre- training by  training books and return demonstration  Reviewed Pump Set Up including  Menu Settings  Bolus Settings:    Insulin Carb Ratio of 1 unit / 2.5 grams     Insulin Sensitivity Factor of 1 unit / 10 mg/dl              Target: 80-95 mg/dl  Suspend  Basal with initial Basal Rate of 4.0 units/hour  Reservoir Set Up  Utilities  Pump Training Checklist completed  Patient is aware to sign up for [employer] and agrees to upload within 1 day for review of progress and allow for pump setting adjustments.  Patient successfully completed pump start and instructed to call me if BG drops below 70 mg/dl or goes above 180 mg/dl or as directed by MD  Follow up plan: she will set up [facility] account today, text or call me within 24 hours with updated BG info, upload to [facility] by Monday (4 days) and come back for follow up visit in 1 week for 2nd [facility]Link review and pump follow up visit.

## 2016-09-26 ENCOUNTER — Encounter: Payer: 59 | Admitting: *Deleted

## 2016-09-26 DIAGNOSIS — Z713 Dietary counseling and surveillance: Secondary | ICD-10-CM | POA: Diagnosis present

## 2016-09-26 DIAGNOSIS — E119 Type 2 diabetes mellitus without complications: Secondary | ICD-10-CM | POA: Diagnosis not present

## 2016-09-26 DIAGNOSIS — O24119 Pre-existing diabetes mellitus, type 2, in pregnancy, unspecified trimester: Secondary | ICD-10-CM

## 2016-09-26 NOTE — Progress Notes (Signed)
  Pump Follow Up Progress Note  Orders received from MD giving me permission to make insulin pump adjustments for the following patient.  Reviewed blood glucose logs on 09/26/2016 via: [facility] and found the following:              Hypoglycemia Hyperglycemia Comments  Overnight Period:      Pre-Meal:    Breakfast YES  BASAL   Lunch      Supper     Post-Meal: Breakfast      Lunch      Supper  YES ICR  Bedtime:       Comments: Patient has been on pump for 1 week. She has not been able to upload on [facility]Link from home yet, I have suggested she contact [place] for assistance. Her FBG have been in the 60's and below so I plan to decrease by 10% per below. Her post meal BG after 5 PM are too high so will increase ICR per below.   Pump Settings:   Date: 09/26/16   Basal Rate: Carb Ratio Sensitivity   MN: 3.60  (-) MN: 2.5 10  3  A: 4.00 5 PM: 2.3 (+)                             Plan: Continue using Bolus Wizard for meals and corrections See Dr. Buddy Duty as scheduled until delivery which is planned for the week of 10/07/16 Let me know via cell phone text if any questions or concerns   Follow up:  Pump adjustments completed until delivery and patient already has appointments with Dr. Buddy Duty.

## 2016-10-03 ENCOUNTER — Encounter (HOSPITAL_COMMUNITY): Payer: Self-pay | Admitting: *Deleted

## 2016-10-03 ENCOUNTER — Telehealth (HOSPITAL_COMMUNITY): Payer: Self-pay | Admitting: *Deleted

## 2016-10-03 ENCOUNTER — Other Ambulatory Visit: Payer: Self-pay | Admitting: Obstetrics and Gynecology

## 2016-10-03 NOTE — Telephone Encounter (Signed)
Preadmission screen  

## 2016-10-07 ENCOUNTER — Inpatient Hospital Stay (HOSPITAL_COMMUNITY): Payer: 59 | Admitting: Anesthesiology

## 2016-10-07 ENCOUNTER — Inpatient Hospital Stay (HOSPITAL_COMMUNITY)
Admission: RE | Admit: 2016-10-07 | Discharge: 2016-10-09 | DRG: 767 | Disposition: A | Discharge status: Home / Self Care | Payer: 59 | Source: Ambulatory Visit | Attending: Obstetrics and Gynecology | Admitting: Obstetrics and Gynecology

## 2016-10-07 ENCOUNTER — Encounter (HOSPITAL_COMMUNITY): Payer: Self-pay

## 2016-10-07 DIAGNOSIS — E119 Type 2 diabetes mellitus without complications: Secondary | ICD-10-CM

## 2016-10-07 DIAGNOSIS — D696 Thrombocytopenia, unspecified: Secondary | ICD-10-CM

## 2016-10-07 DIAGNOSIS — Z6837 Body mass index (BMI) 37.0-37.9, adult: Secondary | ICD-10-CM | POA: Diagnosis not present

## 2016-10-07 DIAGNOSIS — E669 Obesity, unspecified: Secondary | ICD-10-CM | POA: Diagnosis present

## 2016-10-07 DIAGNOSIS — O99214 Obesity complicating childbirth: Secondary | ICD-10-CM | POA: Diagnosis present

## 2016-10-07 DIAGNOSIS — Z8249 Family history of ischemic heart disease and other diseases of the circulatory system: Secondary | ICD-10-CM

## 2016-10-07 DIAGNOSIS — Z3A37 37 weeks gestation of pregnancy: Secondary | ICD-10-CM | POA: Diagnosis not present

## 2016-10-07 DIAGNOSIS — O9912 Other diseases of the blood and blood-forming organs and certain disorders involving the immune mechanism complicating childbirth: Secondary | ICD-10-CM | POA: Diagnosis present

## 2016-10-07 DIAGNOSIS — Z87891 Personal history of nicotine dependence: Secondary | ICD-10-CM | POA: Diagnosis not present

## 2016-10-07 DIAGNOSIS — O1400 Mild to moderate pre-eclampsia, unspecified trimester: Secondary | ICD-10-CM | POA: Diagnosis present

## 2016-10-07 DIAGNOSIS — O1404 Mild to moderate pre-eclampsia, complicating childbirth: Principal | ICD-10-CM | POA: Diagnosis present

## 2016-10-07 DIAGNOSIS — Z302 Encounter for sterilization: Secondary | ICD-10-CM

## 2016-10-07 DIAGNOSIS — O09529 Supervision of elderly multigravida, unspecified trimester: Secondary | ICD-10-CM

## 2016-10-07 DIAGNOSIS — Z794 Long term (current) use of insulin: Secondary | ICD-10-CM | POA: Diagnosis not present

## 2016-10-07 DIAGNOSIS — O24119 Pre-existing diabetes mellitus, type 2, in pregnancy, unspecified trimester: Secondary | ICD-10-CM

## 2016-10-07 DIAGNOSIS — O2412 Pre-existing diabetes mellitus, type 2, in childbirth: Secondary | ICD-10-CM | POA: Diagnosis present

## 2016-10-07 DIAGNOSIS — Z9641 Presence of insulin pump (external) (internal): Secondary | ICD-10-CM | POA: Diagnosis present

## 2016-10-07 LAB — CBC
HCT: 30.5 % — ABNORMAL LOW (ref 36.0–46.0)
HEMATOCRIT: 27.4 % — AB (ref 36.0–46.0)
HEMOGLOBIN: 9.8 g/dL — AB (ref 12.0–15.0)
HEMOGLOBIN: 10.5 g/dL — AB (ref 12.0–15.0)
MCH: 29.9 pg (ref 26.0–34.0)
MCH: 29.2 pg (ref 26.0–34.0)
MCHC: 35.8 g/dL (ref 30.0–36.0)
MCHC: 34.4 g/dL (ref 30.0–36.0)
MCV: 83.5 fL (ref 78.0–100.0)
MCV: 85 fL (ref 78.0–100.0)
Platelets: 107 10*3/uL — ABNORMAL LOW (ref 150–400)
Platelets: 101 10*3/uL — ABNORMAL LOW (ref 150–400)
RBC: 3.28 MIL/uL — ABNORMAL LOW (ref 3.87–5.11)
RBC: 3.59 MIL/uL — ABNORMAL LOW (ref 3.87–5.11)
RDW: 15.4 % (ref 11.5–15.5)
RDW: 15.2 % (ref 11.5–15.5)
WBC: 11.4 10*3/uL — ABNORMAL HIGH (ref 4.0–10.5)
WBC: 9.9 10*3/uL (ref 4.0–10.5)

## 2016-10-07 LAB — GLUCOSE, CAPILLARY
GLUCOSE-CAPILLARY: 174 mg/dL — AB (ref 65–99)
GLUCOSE-CAPILLARY: 96 mg/dL (ref 65–99)
GLUCOSE-CAPILLARY: 83 mg/dL (ref 65–99)
GLUCOSE-CAPILLARY: 178 mg/dL — AB (ref 65–99)
Glucose-Capillary: 97 mg/dL (ref 65–99)
Glucose-Capillary: 73 mg/dL (ref 65–99)
Glucose-Capillary: 79 mg/dL (ref 65–99)
Glucose-Capillary: 120 mg/dL — ABNORMAL HIGH (ref 65–99)

## 2016-10-07 LAB — COMPREHENSIVE METABOLIC PANEL
ALBUMIN: 2.8 g/dL — AB (ref 3.5–5.0)
ALT: 16 U/L (ref 14–54)
ANION GAP: 8 (ref 5–15)
AST: 26 U/L (ref 15–41)
Alkaline Phosphatase: 181 U/L — ABNORMAL HIGH (ref 38–126)
BUN: 14 mg/dL (ref 6–20)
CHLORIDE: 106 mmol/L (ref 101–111)
CO2: 20 mmol/L — AB (ref 22–32)
Calcium: 8.8 mg/dL — ABNORMAL LOW (ref 8.9–10.3)
Creatinine, Ser: 0.54 mg/dL (ref 0.44–1.00)
GFR calc Af Amer: >60 mL/min (ref >60)
GFR calc non Af Amer: >60 mL/min (ref >60)
Glucose, Bld: 176 mg/dL — ABNORMAL HIGH (ref 65–99)
POTASSIUM: 3.8 mmol/L (ref 3.5–5.1)
SODIUM: 134 mmol/L — AB (ref 135–145)
Total Bilirubin: 0.7 mg/dL (ref 0.3–1.2)
Total Protein: 6.4 g/dL — ABNORMAL LOW (ref 6.5–8.1)

## 2016-10-07 LAB — RPR: RPR Ser Ql: NONREACTIVE

## 2016-10-07 LAB — TYPE AND SCREEN
ABO/RH(D): AB POS
Antibody Screen: NEGATIVE

## 2016-10-07 LAB — LACTATE DEHYDROGENASE: LDH: 242 U/L — ABNORMAL HIGH (ref 98–192)

## 2016-10-07 LAB — URIC ACID: Uric Acid, Serum: 5.1 mg/dL (ref 2.3–6.6)

## 2016-10-07 MED ORDER — OXYTOCIN BOLUS FROM INFUSION
500.0000 mL | Freq: Once | INTRAVENOUS | Status: AC
  Administered 2016-10-07: 500 mL via INTRAVENOUS

## 2016-10-07 MED ORDER — TERBUTALINE SULFATE 1 MG/ML IJ SOLN
0.2500 mg | Freq: Once | INTRAMUSCULAR | Status: DC | PRN
  Filled 2016-10-07: qty 1

## 2016-10-07 MED ORDER — OXYTOCIN 40 UNITS IN LACTATED RINGERS INFUSION - SIMPLE MED
2.5000 [IU]/h | INTRAVENOUS | Status: DC
  Administered 2016-10-07: 2.5 [IU]/h via INTRAVENOUS

## 2016-10-07 MED ORDER — MISOPROSTOL 200 MCG PO TABS
ORAL_TABLET | ORAL | Status: AC
  Administered 2016-10-07: 1000 ug
  Filled 2016-10-07: qty 4

## 2016-10-07 MED ORDER — ONDANSETRON HCL 4 MG/2ML IJ SOLN: 4.0000 mg | Freq: Four times a day (QID) | INTRAMUSCULAR | Status: DC | PRN

## 2016-10-07 MED ORDER — DEXTROSE 50 % IV SOLN: 25.0000 mL | INTRAVENOUS | Status: DC | PRN

## 2016-10-07 MED ORDER — EPHEDRINE 5 MG/ML INJ
10.0000 mg | INTRAVENOUS | Status: DC | PRN
  Filled 2016-10-07: qty 4

## 2016-10-07 MED ORDER — TETANUS-DIPHTH-ACELL PERTUSSIS 5-2.5-18.5 LF-MCG/0.5 IM SUSP: 0.5000 mL | Freq: Once | INTRAMUSCULAR | Status: DC

## 2016-10-07 MED ORDER — BENZOCAINE-MENTHOL 20-0.5 % EX AERO: 1.0000 "application " | INHALATION_SPRAY | CUTANEOUS | Status: DC | PRN

## 2016-10-07 MED ORDER — PHENYLEPHRINE 40 MCG/ML (10ML) SYRINGE FOR IV PUSH (FOR BLOOD PRESSURE SUPPORT)
80.0000 ug | PREFILLED_SYRINGE | INTRAVENOUS | Status: DC | PRN
  Filled 2016-10-07: qty 5
  Filled 2016-10-07: qty 10

## 2016-10-07 MED ORDER — DIPHENHYDRAMINE HCL 25 MG PO CAPS: 25.0000 mg | ORAL_CAPSULE | Freq: Four times a day (QID) | ORAL | Status: DC | PRN

## 2016-10-07 MED ORDER — SENNOSIDES-DOCUSATE SODIUM 8.6-50 MG PO TABS
2.0000 | ORAL_TABLET | ORAL | Status: DC
  Filled 2016-10-07: qty 2

## 2016-10-07 MED ORDER — LACTATED RINGERS IV SOLN: 500.0000 mL | INTRAVENOUS | Status: DC | PRN

## 2016-10-07 MED ORDER — INSULIN ASPART 100 UNIT/ML ~~LOC~~ SOLN: 0.0000 [IU] | SUBCUTANEOUS | Status: DC

## 2016-10-07 MED ORDER — LIDOCAINE HCL (PF) 1 % IJ SOLN
INTRAMUSCULAR | Status: DC | PRN
  Administered 2016-10-07 (×2): 7 mL via EPIDURAL

## 2016-10-07 MED ORDER — INSULIN PUMP
SUBCUTANEOUS | Status: DC
  Administered 2016-10-07 – 2016-10-08 (×3): via SUBCUTANEOUS
  Filled 2016-10-07: qty 1

## 2016-10-07 MED ORDER — PHENYLEPHRINE 40 MCG/ML (10ML) SYRINGE FOR IV PUSH (FOR BLOOD PRESSURE SUPPORT)
80.0000 ug | PREFILLED_SYRINGE | INTRAVENOUS | Status: DC | PRN
  Filled 2016-10-07: qty 5

## 2016-10-07 MED ORDER — FENTANYL 2.5 MCG/ML BUPIVACAINE 1/10 % EPIDURAL INFUSION (WH - ANES)
14.0000 mL/h | INTRAMUSCULAR | Status: DC | PRN
  Administered 2016-10-07: 14 mL/h via EPIDURAL
  Filled 2016-10-07: qty 100

## 2016-10-07 MED ORDER — DIPHENHYDRAMINE HCL 50 MG/ML IJ SOLN: 12.5000 mg | INTRAMUSCULAR | Status: DC | PRN

## 2016-10-07 MED ORDER — FLEET ENEMA 7-19 GM/118ML RE ENEM: 1.0000 | ENEMA | RECTAL | Status: DC | PRN

## 2016-10-07 MED ORDER — ZOLPIDEM TARTRATE 5 MG PO TABS: 5.0000 mg | ORAL_TABLET | Freq: Every evening | ORAL | Status: DC | PRN

## 2016-10-07 MED ORDER — FENTANYL CITRATE (PF) 100 MCG/2ML IJ SOLN: 50.0000 ug | INTRAMUSCULAR | Status: DC | PRN

## 2016-10-07 MED ORDER — SIMETHICONE 80 MG PO CHEW: 80.0000 mg | CHEWABLE_TABLET | ORAL | Status: DC | PRN

## 2016-10-07 MED ORDER — ACETAMINOPHEN 325 MG PO TABS: 650.0000 mg | ORAL_TABLET | ORAL | Status: DC | PRN

## 2016-10-07 MED ORDER — DEXTROSE-NACL 5-0.45 % IV SOLN: INTRAVENOUS | Status: DC

## 2016-10-07 MED ORDER — ONDANSETRON HCL 4 MG PO TABS: 4.0000 mg | ORAL_TABLET | ORAL | Status: DC | PRN

## 2016-10-07 MED ORDER — OXYTOCIN 40 UNITS IN LACTATED RINGERS INFUSION - SIMPLE MED
1.0000 m[IU]/min | INTRAVENOUS | Status: DC
  Administered 2016-10-07: 2 m[IU]/min via INTRAVENOUS
  Filled 2016-10-07: qty 1000

## 2016-10-07 MED ORDER — SODIUM CHLORIDE 0.9 % IV SOLN
INTRAVENOUS | Status: DC
  Filled 2016-10-07: qty 2.5

## 2016-10-07 MED ORDER — COCONUT OIL OIL: 1.0000 "application " | TOPICAL_OIL | Status: DC | PRN

## 2016-10-07 MED ORDER — IBUPROFEN 600 MG PO TABS
600.0000 mg | ORAL_TABLET | Freq: Four times a day (QID) | ORAL | Status: DC
  Administered 2016-10-07: 600 mg via ORAL
  Filled 2016-10-07 (×2): qty 1

## 2016-10-07 MED ORDER — SODIUM CHLORIDE 0.45 % IV SOLN: INTRAVENOUS | Status: DC

## 2016-10-07 MED ORDER — LIDOCAINE HCL (PF) 1 % IJ SOLN
30.0000 mL | INTRAMUSCULAR | Status: DC | PRN
  Filled 2016-10-07: qty 30

## 2016-10-07 MED ORDER — MISOPROSTOL 25 MCG QUARTER TABLET
25.0000 ug | ORAL_TABLET | ORAL | Status: DC | PRN
  Administered 2016-10-07 (×2): 25 ug via VAGINAL
  Filled 2016-10-07: qty 0.25
  Filled 2016-10-07: qty 1
  Filled 2016-10-07: qty 0.25

## 2016-10-07 MED ORDER — PRENATAL MULTIVITAMIN CH: 1.0000 | ORAL_TABLET | Freq: Every day | ORAL | Status: DC

## 2016-10-07 MED ORDER — OXYTOCIN 40 UNITS IN LACTATED RINGERS INFUSION - SIMPLE MED: 1.0000 m[IU]/min | INTRAVENOUS | Status: DC

## 2016-10-07 MED ORDER — DIBUCAINE 1 % RE OINT: 1.0000 "application " | TOPICAL_OINTMENT | RECTAL | Status: DC | PRN

## 2016-10-07 MED ORDER — INSULIN REGULAR BOLUS VIA INFUSION
0.0000 [IU] | Freq: Three times a day (TID) | INTRAVENOUS | Status: DC
  Filled 2016-10-07: qty 10

## 2016-10-07 MED ORDER — SOD CITRATE-CITRIC ACID 500-334 MG/5ML PO SOLN: 30.0000 mL | ORAL | Status: DC | PRN

## 2016-10-07 MED ORDER — ONDANSETRON HCL 4 MG/2ML IJ SOLN: 4.0000 mg | INTRAMUSCULAR | Status: DC | PRN

## 2016-10-07 MED ORDER — WITCH HAZEL-GLYCERIN EX PADS
1.0000 | MEDICATED_PAD | CUTANEOUS | Status: DC | PRN
Start: 2016-10-07 — End: 2016-10-08

## 2016-10-07 MED ORDER — LACTATED RINGERS IV SOLN
INTRAVENOUS | Status: DC
  Administered 2016-10-07 (×2): via INTRAVENOUS

## 2016-10-07 MED ORDER — LACTATED RINGERS IV SOLN
500.0000 mL | Freq: Once | INTRAVENOUS | Status: AC
  Administered 2016-10-07: 500 mL via INTRAVENOUS

## 2016-10-07 NOTE — Anesthesia Procedure Notes (Signed)
Epidural Patient location during procedure: OB Start time: 10/07/2016 11:29 AM End time: 10/07/2016 11:32 AM  Staffing Anesthesiologist: Lyn Hollingshead Performed: anesthesiologist   Preanesthetic Checklist Completed: patient identified, surgical consent, pre-op evaluation, timeout performed, IV checked, risks and benefits discussed and monitors and equipment checked  Epidural Patient position: sitting Prep: site prepped and draped and DuraPrep Patient monitoring: continuous pulse ox and blood pressure Approach: midline Location: L3-L4 Injection technique: LOR air  Needle:  Needle type: Tuohy  Needle gauge: 17 G Needle length: 9 cm and 9 Needle insertion depth: 5 cm cm Catheter type: closed end flexible Catheter size: 19 Gauge Catheter at skin depth: 10 cm Test dose: negative and Other  Assessment Sensory level: T9 Events: blood not aspirated, injection not painful, no injection resistance, negative IV test and no paresthesia  Additional Notes Reason for block:procedure for pain

## 2016-10-07 NOTE — H&P (Signed)
[name] is a 41 y.o. female, [phone] at 68 weeks, presenting for IOL secondary to Mild PreEclampsia and IDDM.  Patient is under the care of [name].  Pregnancy and medical history remarkable as listed below in problem list.  Patient reports eating prior to arrival, but has not tested her BS.   Patient Active Problem List   Diagnosis Date Noted  . 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 Advanced maternal age in multigravida 10/07/2016  . Mild preeclampsia 08/29/2016  . Preeclampsia 08/24/2016  . Pre-existing type 2 diabetes affecting pregnancy, antepartum 04/24/2016  . History of reversal of tubal ligation 02/20/2016  . H/O tubal ligation 02/20/2016  . Diabetes mellitus ([place]) 02/20/2016  . Vaginal bleeding 02/20/2016    History of present pregnancy: Patient entered care at 4 weeks.   EDC of 10/28/2016 was established by Definite LMP of 01/22/16.   Anatomy scan:  17.4 weeks, with normal findings and an right lateral placenta.    Significant prenatal events: Admitted at 30.5 wks to 31.3wks for PreEclampsia.  Received BMZ.  Last evaluation:  10/05/2016 by [name] in office.  FHR 134, BP 130/70.  SVE on 10/02/16 1/40/-3   OB History    Gravida Para Term Preterm AB Living   8 3 1 2 4 3    SAB TAB Ectopic Multiple Live Births   2 0 1         Past Medical History:  Diagnosis Date  . Diabetes mellitus without complication (HCC)    insulin  . Fibroid   . HPV in female   . PID (acute pelvic inflammatory disease)   . Vaginal Pap smear, abnormal    Past Surgical History:  Procedure Laterality Date  . carpel tunnel    . COLPOSCOPY W/ BIOPSY / CURETTAGE    . DILATION AND CURETTAGE OF UTERUS    . laporoscopy    . ROTATOR CUFF REPAIR    . TUBAL LIGATION    . tubal reversal     Family History: family history includes Cancer in her maternal aunt; Diabetes in her daughter, father, mother, and sister; Hypertension in her father and mother; Kidney disease in her  daughter. Social History:  reports that she has quit smoking. She has quit using smokeless tobacco. She reports that she drinks alcohol. She reports that she does not use drugs.   Prenatal Transfer Tool  Maternal Diabetes: Yes:  Diabetes Type:  Pre-pregnancy Genetic Screening: Normal Maternal Ultrasounds/Referrals: Normal Fetal Ultrasounds or other Referrals:  None Maternal Substance Abuse:  No Significant Maternal Medications:  Meds include: Other: Insulin Significant Maternal Lab Results: Lab values include: Group B Strep negative    ROS:  -Ctx, +FM, -Vb, -Headache, -Visual disturbances, -Epigastric pain No recent illness No N/V or diarrhea, but some recent constipation  Allergies  Allergen Reactions  . Sulfa Antibiotics Anaphylaxis     Last menstrual period 01/22/2016.  Physical Exam  Constitutional: She is oriented to person, place, and time. She appears well-developed and well-nourished. No distress.  HENT:  Head: Normocephalic and atraumatic.  Eyes: Conjunctivae are normal.  Neck: Normal range of motion.  Cardiovascular: Normal rate, regular rhythm and normal heart sounds.   Respiratory: Effort normal and breath sounds normal.  GI: Soft. Bowel sounds are normal.  Gravid--fundal height appears AGA, Soft, NT  Musculoskeletal: Normal range of motion. She exhibits no edema.  Neurological: She is alert and oriented to person, place, and time.  Skin: Skin is warm and dry.  [place]: EFW: 6lbs 1oz on 09/28/16 Presentation: Vertex by [place]  FHR: 145 bpm, Mod Var, -Decels, +Accels UCs:  None graphed  Prenatal labs: ABO, Rh: --/--/AB POS (12/12 0553) Antibody: NEG (12/12 0553) Rubella:  Immune  RPR:   NR HBsAg:   Negative HIV:   NR GBS: Negative (12/09 0000) Sickle cell/Hgb electrophoresis:  Normal Pap:  Unknown GC:  NR Chlamydia:  NR Other:  Hgb A1C: 8.9 7/17 5.9 11/17   Assessment IUP at 37wks Cat I FT DM-T1 PreEclampsia AMA GBS Negative Desires BTL    Plan: Admit to [facility] per consult with [name] Routine Labor and Delivery Orders per [facility] Protocol In room to complete assessment and discuss POC: BS Q 4HRs Cytotec x 3 Doses or less, then initiate pitocin Start BS QHR with initiation of pitocin Maintain BS <150 or start sliding scale as appropriate Will reduce basal rate of insulin in PPP Discussed BTL in PPP including complete removal of fallopian tubes.  Verbalizes understanding and desires procedure SuzanneSR to be updated as appropriate  Suzanne Quill, MSN 10/07/2016, 12:16 AM

## 2016-10-07 NOTE — Progress Notes (Signed)
[name] is a 41 y.o. [phone] at 73w0dadmitted for induction of labor due to Diabetes, Mild Preeclampsia;   Subjective:  UnComfortable with  Contractions; requesting epidural  Objective: BP 138/83   Pulse 94   Temp 98.1 F (36.7 C) (Oral)   Resp 20   Ht 5\' 1"  (1.549 m)   Wt 196 lb (88.9 kg)   LMP 01/22/2016   BMI 37.03 kg/m  No intake/output data recorded. No intake/output data recorded.  FHT: Category 1 SVE:   Dilation: 2.5 Effacement (%): 90 Station: -1 Exam by:: L.Mohmed Farver, CNM  Labs: Lab Results  Component Value Date   WBC 9.9 10/07/2016   HGB 10.5 (L) 10/07/2016   HCT 30.5 (L) 10/07/2016   MCV 85.0 10/07/2016   PLT 101 (L) 10/07/2016    Assessment / Plan:  Start PItocin after epidural placement Glucose Stabilizer Protocol Blood pressures Stable- No severe features Thrombocytopenia Platelets 101 Anticipate AROM when comfortable Fetal Wellbeing: reassuring Anticipated MOD:  NSVD  Larey Days CNM MSN 10/07/2016, 11:22 AM

## 2016-10-07 NOTE — Anesthesia Preprocedure Evaluation (Signed)
Anesthesia Evaluation  Patient identified by MRN, date of birth, ID band Patient awake    Reviewed: Allergy & Precautions, H&P , NPO status , Patient's Chart, lab work & pertinent test results  Airway Mallampati: II  TM Distance: >3 FB Neck ROM: full    Dental no notable dental hx.    Pulmonary former smoker,    Pulmonary exam normal        Cardiovascular Normal cardiovascular exam     Neuro/Psych negative neurological ROS  negative psych ROS   GI/Hepatic negative GI ROS, Neg liver ROS,   Endo/Other  diabetes  Renal/GU negative Renal ROS     Musculoskeletal   Abdominal (+) + obese,   Peds  Hematology negative hematology ROS (+)   Anesthesia Other Findings   Reproductive/Obstetrics (+) Pregnancy                             Anesthesia Physical Anesthesia Plan  ASA: III  Anesthesia Plan: Epidural   Post-op Pain Management:    Induction:   Airway Management Planned:   Additional Equipment:   Intra-op Plan:   Post-operative Plan:   Informed Consent: I have reviewed the patients History and Physical, chart, labs and discussed the procedure including the risks, benefits and alternatives for the proposed anesthesia with the patient or authorized representative who has indicated his/her understanding and acceptance.     Plan Discussed with:   Anesthesia Plan Comments:         Anesthesia Quick Evaluation

## 2016-10-07 NOTE — Progress Notes (Addendum)
[name] is a 41 y.o. [phone] at 61w0dadmitted for induction of labor due to Diabetes and Iddm and GDM.  Subjective:  Comfortable with  Epidural Contractions every 2 minutes, lasting 45-60 seconds, intensity     Objective: BP 134/75   Pulse 94   Temp 98.1 F (36.7 C) (Oral)   Resp 18   Ht 5\' 1"  (1.549 m)   Wt 196 lb (88.9 kg)   LMP 01/22/2016   SpO2 99%   BMI 37.03 kg/m  No intake/output data recorded. No intake/output data recorded.  FHT:  FHR: Category 1 ; having some early decelerations bpm, variability: moderate,  accelerations:  Present,  decelerations:  Present early  SVE:  4/100/-1 Labs: Lab Results  Component Value Date   WBC 9.9 10/07/2016   HGB 10.5 (L) 10/07/2016   HCT 30.5 (L) 10/07/2016   MCV 85.0 10/07/2016   PLT 101 (L) 10/07/2016    Assessment / Plan: IUPC placed Amnioinfusion will be started if needed Fetal Wellbeing: reassuring Anticipated MOD:  NSVD  Larey Days CNM MSN 10/07/2016, 2:43 PM   ADDENDUM:  IV insulin/glucoStabilizer ordered, patient to stop insulin pump, check FSG Q 1 hr.    Dr. Alesia Richards.  10/07/2016 1510

## 2016-10-07 NOTE — Progress Notes (Signed)
[name] MRN: [id number]  Subjective: -Strip and Chart Reviewed.  Nurse reports BS at 0200 of 174 was corrected, via insulin pump, with 3.8U. 2nd Dose of cytotec to be placed. Labs as below.   Objective: BP 138/71   Pulse 98   Temp 98.2 F (36.8 C) (Oral)   Resp 18   Ht 5' 1"  (1.549 m)   Wt 88.9 kg (196 lb)   LMP 01/22/2016   BMI 37.03 kg/m  No intake/output data recorded. No intake/output data recorded.  Results for orders placed or performed during the hospital encounter of 10/07/16 (from the past 48 hour(s))  CBC     Status: Abnormal   Collection Time: 10/07/16  1:35 AM  Result Value Ref Range   WBC 11.4 (H) 4.0 - 10.5 K/uL   RBC 3.28 (L) 3.87 - 5.11 MIL/uL   Hemoglobin 9.8 (L) 12.0 - 15.0 g/dL   HCT 27.4 (L) 36.0 - 46.0 %   MCV 83.5 78.0 - 100.0 fL   MCH 29.9 26.0 - 34.0 pg   MCHC 35.8 30.0 - 36.0 g/dL   RDW 15.4 11.5 - 15.5 %   Platelets 107 (L) 150 - 400 K/uL    Comment: SPECIMEN CHECKED FOR CLOTS REPEATED TO VERIFY CONSISTENT WITH PREVIOUS RESULT   Type and screen     Status: None   Collection Time: 10/07/16  1:35 AM  Result Value Ref Range   ABO/RH(D) AB POS    Antibody Screen NEG    Sample Expiration 10/10/2016   Comprehensive metabolic panel     Status: Abnormal   Collection Time: 10/07/16  1:35 AM  Result Value Ref Range   Sodium 134 (L) 135 - 145 mmol/L   Potassium 3.8 3.5 - 5.1 mmol/L   Chloride 106 101 - 111 mmol/L   CO2 20 (L) 22 - 32 mmol/L   Glucose, Bld 176 (H) 65 - 99 mg/dL   BUN 14 6 - 20 mg/dL   Creatinine, Ser 0.54 0.44 - 1.00 mg/dL   Calcium 8.8 (L) 8.9 - 10.3 mg/dL   Total Protein 6.4 (L) 6.5 - 8.1 g/dL   Albumin 2.8 (L) 3.5 - 5.0 g/dL   AST 26 15 - 41 U/L   ALT 16 14 - 54 U/L   Alkaline Phosphatase 181 (H) 38 - 126 U/L   Total Bilirubin 0.7 0.3 - 1.2 mg/dL   GFR calc non Af Amer >60 >60 mL/min   GFR calc Af Amer >60 >60 mL/min    Comment: (NOTE) The eGFR has been calculated using the CKD EPI equation. This calculation  has not been validated in all clinical situations. eGFR's persistently <60 mL/min signify possible Chronic Kidney Disease.    Anion gap 8 5 - 15  Lactate dehydrogenase     Status: Abnormal   Collection Time: 10/07/16  1:35 AM  Result Value Ref Range   LDH 242 (H) 98 - 192 U/L  Uric acid     Status: None   Collection Time: 10/07/16  1:35 AM  Result Value Ref Range   Uric Acid, Serum 5.1 2.3 - 6.6 mg/dL  Glucose, capillary     Status: Abnormal   Collection Time: 10/07/16  1:54 AM  Result Value Ref Range   Glucose-Capillary 174 (H) 65 - 99 mg/dL    Fetal Monitoring: FHT: 145 bpm, Mod Var, +Occasional Variable Decels, +Accels UC: Irritability Noted    Vaginal Exam: SVE:   Dilation: Fingertip Effacement (%): Thick Station: -3 Exam by::  Gailen Shelter RN  Membranes:Intact Internal Monitors: None  Augmentation/Induction: Pitocin:None Cytotec: 2nd Dose at 0630  Assessment:  IUP at 37wks Cat I FT Overall IDDM PreEclampsia IOL Bishop Score  Plan: -Continue mgmt as ordered -Dr. Vidal Schwalbe to be updated -L.Clemmons, CNM to assume care at [place] Linken Mcglothen,MSN, CNM 10/07/2016, 6:25 AM

## 2016-10-07 NOTE — Anesthesia Pain Management Evaluation Note (Signed)
  CRNA Pain Management Visit Note  Patient: [name], 41 y.o., female  "Hello I am a member of the anesthesia team at [facility]. We have an anesthesia team available at all times to provide care throughout the hospital, including epidural management and anesthesia for C-section. I don't know your plan for the delivery whether it a natural birth, water birth, IV sedation, nitrous supplementation, doula or epidural, but we want to meet your pain goals."   1.Was your pain managed to your expectations on prior hospitalizations?   Yes   2.What is your expectation for pain management during this hospitalization?     Epidural  3.How can we help you reach that goal?   Record the patient's initial score and the patient's pain goal.   Pain: 4  Pain Goal: 4 [facility] wants you to be able to say your pain was always managed very well.  Jabier Mutton 10/07/2016

## 2016-10-08 ENCOUNTER — Inpatient Hospital Stay (HOSPITAL_COMMUNITY): Payer: 59 | Admitting: Anesthesiology

## 2016-10-08 ENCOUNTER — Encounter (HOSPITAL_COMMUNITY)
Admission: RE | Disposition: A | Discharge status: Home / Self Care | Payer: Self-pay | Source: Ambulatory Visit | Attending: Obstetrics and Gynecology

## 2016-10-08 HISTORY — PX: TUBAL LIGATION: SHX77

## 2016-10-08 LAB — CBC
HEMATOCRIT: 27.7 % — AB (ref 36.0–46.0)
Hemoglobin: 9.5 g/dL — ABNORMAL LOW (ref 12.0–15.0)
MCH: 29.1 pg (ref 26.0–34.0)
MCHC: 34.3 g/dL (ref 30.0–36.0)
MCV: 85 fL (ref 78.0–100.0)
Platelets: 93 10*3/uL — ABNORMAL LOW (ref 150–400)
RBC: 3.26 MIL/uL — AB (ref 3.87–5.11)
RDW: 15.3 % (ref 11.5–15.5)
WBC: 10.5 10*3/uL (ref 4.0–10.5)

## 2016-10-08 LAB — GLUCOSE, CAPILLARY
GLUCOSE-CAPILLARY: 91 mg/dL (ref 65–99)
GLUCOSE-CAPILLARY: 116 mg/dL — AB (ref 65–99)
Glucose-Capillary: 91 mg/dL (ref 65–99)
Glucose-Capillary: 74 mg/dL (ref 65–99)
Glucose-Capillary: 91 mg/dL (ref 65–99)
Glucose-Capillary: 136 mg/dL — ABNORMAL HIGH (ref 65–99)

## 2016-10-08 SURGERY — LIGATION, FALLOPIAN TUBE, POSTPARTUM
Anesthesia: Epidural | Site: Abdomen | Laterality: Bilateral

## 2016-10-08 MED ORDER — BENZOCAINE-MENTHOL 20-0.5 % EX AERO: 1.0000 "application " | INHALATION_SPRAY | CUTANEOUS | Status: DC | PRN

## 2016-10-08 MED ORDER — OXYCODONE HCL 5 MG PO TABS
5.0000 mg | ORAL_TABLET | ORAL | Status: DC | PRN
  Administered 2016-10-08 – 2016-10-09 (×5): 5 mg via ORAL
  Filled 2016-10-08 (×5): qty 1

## 2016-10-08 MED ORDER — SODIUM BICARBONATE 8.4 % IV SOLN
INTRAVENOUS | Status: AC
  Filled 2016-10-08: qty 50

## 2016-10-08 MED ORDER — MEPERIDINE HCL 25 MG/ML IJ SOLN: 6.2500 mg | INTRAMUSCULAR | Status: DC | PRN

## 2016-10-08 MED ORDER — FAMOTIDINE 20 MG PO TABS
40.0000 mg | ORAL_TABLET | Freq: Once | ORAL | Status: AC
  Administered 2016-10-08: 40 mg via ORAL
  Filled 2016-10-08: qty 2

## 2016-10-08 MED ORDER — PRENATAL MULTIVITAMIN CH
1.0000 | ORAL_TABLET | Freq: Every day | ORAL | Status: DC
  Administered 2016-10-09: 1 via ORAL
  Filled 2016-10-08: qty 1

## 2016-10-08 MED ORDER — FENTANYL CITRATE (PF) 100 MCG/2ML IJ SOLN
INTRAMUSCULAR | Status: DC | PRN
  Administered 2016-10-08 (×6): 50 ug via INTRAVENOUS

## 2016-10-08 MED ORDER — FENTANYL CITRATE (PF) 100 MCG/2ML IJ SOLN
INTRAMUSCULAR | Status: AC
  Filled 2016-10-08: qty 2

## 2016-10-08 MED ORDER — ACETAMINOPHEN 325 MG PO TABS
650.0000 mg | ORAL_TABLET | ORAL | Status: DC | PRN
  Administered 2016-10-08 – 2016-10-09 (×5): 650 mg via ORAL
  Filled 2016-10-08 (×6): qty 2

## 2016-10-08 MED ORDER — HYDROMORPHONE HCL 1 MG/ML IJ SOLN
INTRAMUSCULAR | Status: AC
  Administered 2016-10-08: 0.5 mg via INTRAVENOUS
  Filled 2016-10-08: qty 1

## 2016-10-08 MED ORDER — DIBUCAINE 1 % RE OINT: 1.0000 "application " | TOPICAL_OINTMENT | RECTAL | Status: DC | PRN

## 2016-10-08 MED ORDER — SIMETHICONE 80 MG PO CHEW
80.0000 mg | CHEWABLE_TABLET | ORAL | Status: DC | PRN
  Administered 2016-10-09: 80 mg via ORAL
  Filled 2016-10-08: qty 1

## 2016-10-08 MED ORDER — ONDANSETRON HCL 4 MG/2ML IJ SOLN: 4.0000 mg | INTRAMUSCULAR | Status: DC | PRN

## 2016-10-08 MED ORDER — HYDROCODONE-ACETAMINOPHEN 7.5-325 MG PO TABS: 1.0000 | ORAL_TABLET | Freq: Once | ORAL | Status: DC | PRN

## 2016-10-08 MED ORDER — LACTATED RINGERS IV SOLN
INTRAVENOUS | Status: DC
  Administered 2016-10-08: 20 mL/h via INTRAVENOUS
  Administered 2016-10-08: 11:00:00 via INTRAVENOUS

## 2016-10-08 MED ORDER — LIDOCAINE-EPINEPHRINE (PF) 2 %-1:200000 IJ SOLN
INTRAMUSCULAR | Status: AC
  Filled 2016-10-08: qty 20

## 2016-10-08 MED ORDER — OXYCODONE HCL 5 MG PO TABS
10.0000 mg | ORAL_TABLET | ORAL | Status: DC | PRN
  Filled 2016-10-08: qty 2

## 2016-10-08 MED ORDER — LACTATED RINGERS IV SOLN
INTRAVENOUS | Status: DC | PRN
Start: 2016-10-08 — End: 2016-10-08
  Administered 2016-10-08: 12:00:00 via INTRAVENOUS

## 2016-10-08 MED ORDER — DIPHENHYDRAMINE HCL 25 MG PO CAPS: 25.0000 mg | ORAL_CAPSULE | Freq: Four times a day (QID) | ORAL | Status: DC | PRN

## 2016-10-08 MED ORDER — ONDANSETRON HCL 4 MG PO TABS: 4.0000 mg | ORAL_TABLET | ORAL | Status: DC | PRN

## 2016-10-08 MED ORDER — ONDANSETRON HCL 4 MG/2ML IJ SOLN
INTRAMUSCULAR | Status: AC
  Filled 2016-10-08: qty 2

## 2016-10-08 MED ORDER — MIDAZOLAM HCL 2 MG/2ML IJ SOLN
INTRAMUSCULAR | Status: DC | PRN
  Administered 2016-10-08: 2 mg via INTRAVENOUS

## 2016-10-08 MED ORDER — TETANUS-DIPHTH-ACELL PERTUSSIS 5-2.5-18.5 LF-MCG/0.5 IM SUSP: 0.5000 mL | Freq: Once | INTRAMUSCULAR | Status: DC

## 2016-10-08 MED ORDER — SENNOSIDES-DOCUSATE SODIUM 8.6-50 MG PO TABS
2.0000 | ORAL_TABLET | ORAL | Status: DC
  Administered 2016-10-08: 2 via ORAL
  Filled 2016-10-08: qty 2

## 2016-10-08 MED ORDER — INSULIN ASPART 100 UNIT/ML ~~LOC~~ SOLN: 0.0000 [IU] | SUBCUTANEOUS | Status: DC

## 2016-10-08 MED ORDER — MIDAZOLAM HCL 2 MG/2ML IJ SOLN
INTRAMUSCULAR | Status: AC
  Filled 2016-10-08: qty 2

## 2016-10-08 MED ORDER — METOCLOPRAMIDE HCL 10 MG PO TABS
10.0000 mg | ORAL_TABLET | Freq: Once | ORAL | Status: AC
  Administered 2016-10-08: 10 mg via ORAL
  Filled 2016-10-08: qty 1

## 2016-10-08 MED ORDER — WITCH HAZEL-GLYCERIN EX PADS: 1.0000 "application " | MEDICATED_PAD | CUTANEOUS | Status: DC | PRN

## 2016-10-08 MED ORDER — PNEUMOCOCCAL VAC POLYVALENT 25 MCG/0.5ML IJ INJ
0.5000 mL | INJECTION | INTRAMUSCULAR | Status: DC
  Filled 2016-10-08: qty 0.5

## 2016-10-08 MED ORDER — CEFAZOLIN SODIUM-DEXTROSE 2-3 GM-% IV SOLR
INTRAVENOUS | Status: DC | PRN
  Administered 2016-10-08: 2 g via INTRAVENOUS

## 2016-10-08 MED ORDER — ONDANSETRON HCL 4 MG/2ML IJ SOLN
INTRAMUSCULAR | Status: DC | PRN
  Administered 2016-10-08: 4 mg via INTRAVENOUS

## 2016-10-08 MED ORDER — SODIUM BICARBONATE 8.4 % IV SOLN
INTRAVENOUS | Status: DC | PRN
  Administered 2016-10-08: 5 mL via EPIDURAL
  Administered 2016-10-08: 3 mL via EPIDURAL
  Administered 2016-10-08 (×2): 5 mL via EPIDURAL

## 2016-10-08 MED ORDER — ZOLPIDEM TARTRATE 5 MG PO TABS: 5.0000 mg | ORAL_TABLET | Freq: Every evening | ORAL | Status: DC | PRN

## 2016-10-08 MED ORDER — BUPIVACAINE HCL (PF) 0.25 % IJ SOLN
INTRAMUSCULAR | Status: DC | PRN
  Administered 2016-10-08: 10 mL

## 2016-10-08 MED ORDER — METOCLOPRAMIDE HCL 5 MG/ML IJ SOLN: 10.0000 mg | Freq: Once | INTRAMUSCULAR | Status: DC | PRN

## 2016-10-08 MED ORDER — HYDROMORPHONE HCL 1 MG/ML IJ SOLN
0.2500 mg | INTRAMUSCULAR | Status: DC | PRN
  Administered 2016-10-08 (×2): 0.5 mg via INTRAVENOUS

## 2016-10-08 MED ORDER — LIDOCAINE-EPINEPHRINE (PF) 2 %-1:200000 IJ SOLN
INTRAMUSCULAR | Status: AC
  Filled 2016-10-08: qty 40

## 2016-10-08 MED ORDER — COCONUT OIL OIL: 1.0000 "application " | TOPICAL_OIL | Status: DC | PRN

## 2016-10-08 SURGICAL SUPPLY — 32 items
ADH SKN CLS APL DERMABOND .7 (GAUZE/BANDAGES/DRESSINGS) ×1
CLOTH BEACON ORANGE TIMEOUT ST (SAFETY) ×2 IMPLANT
CONTAINER PREFILL 10% NBF 15ML (MISCELLANEOUS) ×4 IMPLANT
DERMABOND ADVANCED (GAUZE/BANDAGES/DRESSINGS) ×1
DERMABOND ADVANCED .7 DNX12 (GAUZE/BANDAGES/DRESSINGS) ×1 IMPLANT
DRSG OPSITE POSTOP 3X4 (GAUZE/BANDAGES/DRESSINGS) ×2 IMPLANT
DURAPREP 26ML APPLICATOR (WOUND CARE) ×2 IMPLANT
ELECT REM PT RETURN 9FT ADLT (ELECTROSURGICAL) ×2
ELECTRODE REM PT RTRN 9FT ADLT (ELECTROSURGICAL) ×1 IMPLANT
GLOVE BIO SURGEON STRL SZ7.5 (GLOVE) ×2 IMPLANT
GLOVE BIOGEL PI IND STRL 7.0 (GLOVE) ×1 IMPLANT
GLOVE BIOGEL PI IND STRL 7.5 (GLOVE) ×1 IMPLANT
GLOVE BIOGEL PI INDICATOR 7.0 (GLOVE) ×1
GLOVE BIOGEL PI INDICATOR 7.5 (GLOVE) ×1
GOWN STRL REUS W/TWL LRG LVL3 (GOWN DISPOSABLE) ×4 IMPLANT
HEMOSTAT SURGICEL 4X8 (HEMOSTASIS) ×1 IMPLANT
NEEDLE HYPO 22GX1.5 SAFETY (NEEDLE) ×2 IMPLANT
NS IRRIG 1000ML POUR BTL (IV SOLUTION) ×2 IMPLANT
PACK ABDOMINAL MINOR (CUSTOM PROCEDURE TRAY) ×2 IMPLANT
PENCIL BUTTON HOLSTER BLD 10FT (ELECTRODE) ×2 IMPLANT
PROTECTOR NERVE ULNAR (MISCELLANEOUS) ×2 IMPLANT
SPONGE LAP 4X18 X RAY DECT (DISPOSABLE) ×1 IMPLANT
SUT MNCRL AB 3-0 PS2 27 (SUTURE) ×2 IMPLANT
SUT PLAIN 0 NONE (SUTURE) IMPLANT
SUT VIC AB 0 CT1 36 (SUTURE) ×2 IMPLANT
SUT VIC AB 2-0 SH 27 (SUTURE) ×6
SUT VIC AB 2-0 SH 27XBRD (SUTURE) ×2 IMPLANT
SYR CONTROL 10ML LL (SYRINGE) ×2 IMPLANT
TOWEL OR 17X24 6PK STRL BLUE (TOWEL DISPOSABLE) ×4 IMPLANT
TRAY FOLEY CATH SILVER 14FR (SET/KITS/TRAYS/PACK) ×2 IMPLANT
TUBING CONNECTOR 18X5MM (MISCELLANEOUS) ×1 IMPLANT
YANKAUER SUCT BULB TIP NO VENT (SUCTIONS) ×1 IMPLANT

## 2016-10-08 NOTE — Anesthesia Procedure Notes (Signed)
Procedure Name: MAC Performed by: Allexis Bordenave M Pre-anesthesia Checklist: Patient identified, Emergency Drugs available, Suction available, Patient being monitored and Timeout performed Patient Re-evaluated:Patient Re-evaluated prior to induction

## 2016-10-08 NOTE — Progress Notes (Addendum)
Inpatient Diabetes Program Recommendations  AACE/ADA: New Consensus Statement on Inpatient Glycemic Control (2015)  Target Ranges:  Prepandial:   less than 140 mg/dL      Peak postprandial:   less than 180 mg/dL (1-2 hours)      Critically ill patients:  140 - 180 mg/dL   Results for JAMELIAH, CIMAGLIA (MRN [id number]) as of 10/08/2016 07:16  Ref. Range 10/07/2016 06:31 10/07/2016 08:02 10/07/2016 12:48 10/07/2016 13:51 10/07/2016 18:12 10/07/2016 20:31 10/07/2016 23:01 10/08/2016 04:20  Glucose-Capillary Latest Ref Range: 65 - 99 mg/dL 96 83 97 73 79 178 (H) 120 (H) 91   Review of Glycemic Control  Diabetes history: DM2 Outpatient Diabetes medications: Medtronic insulin pump (basal rate 4.0 units/hour, I:CR 1:2.5 grams, I:SF 1:10) Current orders for Inpatient glycemic control: Medtronic insulin pump  Inpatient Diabetes Program Recommendations: Correction (SSI): Please consider using Glycemic Control order set to order CBGs and Novolog 0-15 units Q4H.  NOTE: In reviewing chart, noted patient has pre-existing DM2 and prior to pregnancy she was using [place] and Victoza for DM control. Patent went on a Medtronic insulin pump recently which is managed by Dr. Buddy Duty with assistance from Harvie Bridge, RD, Insulin Pump Trainer at [facility]. Patient delivered on 10/07/16 around 4:30 pm. Spoke with Rise Paganini, RN this morning and she reports that patient removed her insulin pump around 1:30 am today and patient wishes to return back to SQ insulin injections and does not want to resume her insulin pump. Since insulin pump was providing basal, correction, and meal coverage insulin, patient will likely need basal and correction insulin SQ and perhaps for meal coverage once diet is resumed. Patient is scheduled for surgery today at 12:00 and will be NPO. Recommend using Novolog correction Q4H at this time. Will attempt to contact Dr. Buddy Duty for SQ insulin recommendations.  Addendum  10/08/16@11 :00-Called Dr. Cindra Eves office and left a message on his CMA's voicemail requesting return call for recommendations on insulin regimen.  Addendum 10/08/16@12 :44-Spoke with Dr. Buddy Duty regarding glycemic control. Per conversation with Dr. Buddy Duty he agrees with Novolog correction scale as ordered and anticipates patient may not need much insulin initially after delivery but insulin needs may increase over the next several days. Patient has an upcoming appointment with Dr. Buddy Duty on February 2nd. Dr. Buddy Duty stated he will be happy to see patient before then if needed and has asked that patient be asked to call his office with any questions or concerns especially if glucose is elevated once she is discharged home.  Thanks, Barnie Alderman, RN, MSN, CDE Diabetes Coordinator Inpatient Diabetes Program [phone] (Team Pager from 8am to 5pm)

## 2016-10-08 NOTE — Lactation Note (Signed)
This note was copied from a baby's chart. Lactation Consultation Note Mom has 3 older children now 80,16, 40 yrs old. Mom attempted to BF them but it didn't longer than a few weeks. Mom stated she will attempt again. Issues prior were nipple pain and baby's hungry.  Mom has pendulum breast w/everted compressible nipple at the bottom end of breast. Mom hand expressed colostrum.  Educated on newborn feeding habits, I&O, STS, cluster feeding. Supply and demand reviewed as well.  [place] brochure given w/resources, support groups and [place] services. Patient Name: Boy Calaya Schab [id number] Date: 10/08/2016 Reason for consult: Initial assessment   Maternal Data Has patient been taught Hand Expression?: Yes Does the patient have breastfeeding experience prior to this delivery?: Yes  Feeding    LATCH Score/Interventions Latch: Repeated attempts needed to sustain latch, nipple held in mouth throughout feeding, stimulation needed to elicit sucking reflex. Intervention(s): Skin to skin;Teach feeding cues;Waking techniques Intervention(s): Adjust position;Assist with latch;Breast massage;Breast compression  Audible Swallowing: Spontaneous and intermittent Intervention(s): Skin to skin;Hand expression  Type of Nipple: Everted at rest and after stimulation  Comfort (Breast/Nipple): Soft / non-tender     Hold (Positioning): Assistance needed to correctly position infant at breast and maintain latch. Intervention(s): Breastfeeding basics reviewed;Support Pillows;Position options;Skin to skin  LATCH Score: 8  Lactation Tools Discussed/Used     Consult Status Consult Status: Follow-up Date: 10/09/16 Follow-up type: In-patient    Theodoro Kalata 10/08/2016, 5:06 AM

## 2016-10-08 NOTE — Anesthesia Postprocedure Evaluation (Signed)
Anesthesia Post Note  Patient: [name]  Procedure(s) Performed: * No procedures listed *  Patient location during evaluation: Mother Baby Anesthesia Type: Epidural Level of consciousness: awake, awake and alert, oriented and patient cooperative Pain management: pain level controlled Vital Signs Assessment: post-procedure vital signs reviewed and stable Respiratory status: spontaneous breathing, nonlabored ventilation and respiratory function stable Cardiovascular status: stable Postop Assessment: no headache, no backache, patient able to bend at knees and no signs of nausea or vomiting Anesthetic complications: no        Last Vitals:  Vitals:   10/07/16 2336 10/08/16 0415  BP: (!) 141/72 136/79  Pulse: 97   Resp: 18   Temp: 36.8 C     Last Pain:  Vitals:   10/08/16 0725  TempSrc:   PainSc: 0-No pain   Pain Goal:                 [name]

## 2016-10-08 NOTE — Progress Notes (Signed)
Spoke with Dr Mancel Bale regarding pt stating that she was told to continue using insulin pump. Per Dr Mancel Bale, have pt continue insulin pump at basal rate of 2 instead of 4. Check blood sugars every 4 hours tonight, then fasting blood sugar 10/09/16 in the morning before breakfast. Then do 2 hours post prandial blood sugars 10/09/16. Will re-evaluate in the morning. Wille Celeste

## 2016-10-08 NOTE — Transfer of Care (Signed)
Immediate Anesthesia Transfer of Care Note  Patient: [name]  Procedure(s) Performed: Procedure(s): LEFT PARTIAL SALPINGECTOMY (Bilateral)  Patient Location: PACU  Anesthesia Type:Regional  Level of Consciousness: awake, alert  and oriented  Airway & Oxygen Therapy: Patient Spontanous Breathing  Post-op Assessment: Report given to RN and Post -op Vital signs reviewed and stable  Post vital signs: Reviewed and stable  Last Vitals:  Vitals:   10/08/16 1045 10/08/16 1120  BP: 136/72 132/77  Pulse: 88 100  Resp: 18 20  Temp: 36.8 C 37 C    Last Pain:  Vitals:   10/08/16 0942  TempSrc: Oral  PainSc:          Complications: No apparent anesthesia complications

## 2016-10-08 NOTE — Anesthesia Preprocedure Evaluation (Signed)
Anesthesia Evaluation  Patient identified by MRN, date of birth, ID band Patient awake    Reviewed: Allergy & Precautions, H&P , NPO status , Patient's Chart, lab work & pertinent test results  Airway Mallampati: II  TM Distance: >3 FB Neck ROM: full    Dental no notable dental hx. (+) Teeth Intact   Pulmonary former smoker,    Pulmonary exam normal breath sounds clear to auscultation       Cardiovascular hypertension, Normal cardiovascular exam Rhythm:Regular Rate:Normal     Neuro/Psych negative neurological ROS  negative psych ROS   GI/Hepatic negative GI ROS, Neg liver ROS,   Endo/Other  diabetes, Well Controlled, Gestational, Insulin DependentObesity  Renal/GU negative Renal ROS  negative genitourinary   Musculoskeletal negative musculoskeletal ROS (+)   Abdominal (+) + obese,   Peds  Hematology negative hematology ROS (+)   Anesthesia Other Findings   Reproductive/Obstetrics Desires Sterilization- for PPTL                             Anesthesia Physical  Anesthesia Plan  ASA: III  Anesthesia Plan: Epidural   Post-op Pain Management:    Induction:   Airway Management Planned:   Additional Equipment:   Intra-op Plan:   Post-operative Plan:   Informed Consent: I have reviewed the patients History and Physical, chart, labs and discussed the procedure including the risks, benefits and alternatives for the proposed anesthesia with the patient or authorized representative who has indicated his/her understanding and acceptance.     Plan Discussed with: CRNA, Anesthesiologist and Surgeon  Anesthesia Plan Comments:         Anesthesia Quick Evaluation

## 2016-10-08 NOTE — Progress Notes (Signed)
Dr. Mancel Bale notified that the patient discontinued her insulin pump at 0130. Dr. Mancel Bale made aware of Diabetic Coordinator's note and recommendations for insulin management. Orders obtained.

## 2016-10-08 NOTE — Op Note (Signed)
Preop Diagnosis: Desires Sterilization   Postop Diagnosis: Desires Sterilization   Procedure: POST PARTUM LEFT PARTIAL SALPINGECTOMY  Anesthesia: Epidural  Attending: Everett Graff, MD   Assistant: Surgical Tech  Findings: Adhesions of bilateral tubes  Pathology: Portion of Left Fallopian Tube  Fluids: 800 cc  UOP: 150 cc  EBL: 25 cc  Complications: None  Procedure:The patient was taken to the operating room after the risks, benefits, alternative, complications, treatment options, and expected outcomes were discussed with the patient. The patient verbalized understanding, the patient concurred with the proposed plan and consent signed and witnessed. The patient was taken to the Operating Room Four, identified as [name]  and the procedure verified as bilateral tubal ligation. A Time Out was held and the above information confirmed.  The patient was given a surgical level via the epidural and prepped, draped, and catheterized in the normal, sterile fashion in the supine position.  A 26mm incision was made at the umbilicus and carried down to the underlying layer of fascia and peritoneum entered without difficulty.  The left fallopian tube was indentified and carried out to its fimbriated end and Kelly clamp used to clamp tube after elevating with babcock.  The tube was excised and suture ligated with 2-0 vircryl, ligated and suture ligated with bleeding noted at the base adjacent to the ovary.  This was repeated in order to achieve hemostasis as the adjacent ovarian tissue was noted to have some bleeding.  The isthmic portion of the tube was noted to be tightly adherent to the uterus and the remaining portion that included the fimbria for approx. 3cm was noted to be free.  The tube was sent to pathology.  The contralateral side was investigated and the two areas that resembled tube were carried out from the uterine end to the opposite end which was not able to be elevated  and visualized and no fimbria was noted (incidentally - during the procedure the patient said she had a lot of adhesions previously to that right side which she had not mentioned earlier.  I informed the patient that she would need an HSG scheduled at her pp visit).    The fascia was then repaired with 0 vicryl via a running stitch and the incision was closed with 3-0 vicryl via subcuticular sutures and dermabond applied.  Instrument, sponge, and needle counts were correct, patient tolerated the procedure well and was returned to the recovery room in good condition.

## 2016-10-08 NOTE — Lactation Note (Signed)
This note was copied from a baby's chart. Lactation Consultation Note  Patient Name: [name] [id number] Date: 10/08/2016 Reason for consult: Follow-up assessment Baby at 25 hr of life. Mom reports baby is latching well since lactation helped her this am. She denies breast or nipple pain, voiced no concerns. Discussed baby behavior, feeding frequency, baby belly size, voids, wt loss, breast changes, and nipple care. She can easily express large drops of colostrum bilaterally and has spoon in the room. She is aware of lactation services and support group. She will call as needed.      Maternal Data    Feeding Feeding Type: Breast Fed  LATCH Score/Interventions Latch: Grasps breast easily, tongue down, lips flanged, rhythmical sucking. Intervention(s): Waking techniques Intervention(s): Breast compression  Audible Swallowing: A few with stimulation Intervention(s): Skin to skin Intervention(s): Hand expression  Type of Nipple: Everted at rest and after stimulation  Comfort (Breast/Nipple): Soft / non-tender     Hold (Positioning): No assistance needed to correctly position infant at breast. Intervention(s): Support Pillows  LATCH Score: 9  Lactation Tools Discussed/Used     Consult Status Consult Status: Follow-up Date: 10/09/16 Follow-up type: In-patient    Denzil Hughes 10/08/2016, 5:40 PM

## 2016-10-08 NOTE — Progress Notes (Addendum)
Post Partum Day 1 Subjective:  Well. Lochia are normal. Voiding, ambulating, tolerating normal diet.NPO prior to [place] going well.  Objective: Blood pressure 136/72, pulse 88, temperature 98.3 F (36.8 C), resp. rate 18, height 5\' 1"  (1.549 m), weight 196 lb (88.9 kg), last menstrual period 01/22/2016, SpO2 99 %, unknown if currently breastfeeding.  Physical Exam:  General: normal Lochia: appropriate Uterine Fundus: @U  firm non-tender  Extremities: No evidence of DVT seen on physical exam. Edema none     Recent Labs  10/07/16 0135 10/07/16 0825  HGB 9.8* 10.5*  HCT 27.4* 30.5*    Assessment/Plan: Normal Post-partum. Continue routine post-partum care. BTL today @ [id number] discharge tomorrow    LOS: 1 day   Cecille Rubin A Clemmons CNM 10/08/2016, 11:00 AM   R/b/a discussed with patient regarding sterilization.  Pt would like tubes removed if possible.  Risks included but not limited to bleeding, infection and injury, risk of failure/possible ectopic and risk of regret.  Questions answered.  Consent s/w.

## 2016-10-08 NOTE — Anesthesia Postprocedure Evaluation (Signed)
Anesthesia Post Note  Patient: [name]  Procedure(s) Performed: Procedure(s) (LRB): LEFT PARTIAL SALPINGECTOMY (Bilateral)  Patient location during evaluation: Mother Baby Anesthesia Type: Epidural Level of consciousness: awake and alert Pain management: pain level controlled Vital Signs Assessment: post-procedure vital signs reviewed and stable Respiratory status: spontaneous breathing and nonlabored ventilation Cardiovascular status: stable Postop Assessment: no headache, patient able to bend at knees, no backache, epidural receding, adequate PO intake and no signs of nausea or vomiting Anesthetic complications: no        Last Vitals:  Vitals:   10/08/16 1630 10/08/16 1835  BP: 137/76 113/64  Pulse: 92 94  Resp: 18 18  Temp: 36.8 C 36.8 C    Last Pain:  Vitals:   10/08/16 1835  TempSrc: Oral  PainSc: 2    Pain Goal: Patients Stated Pain Goal: 2 (10/08/16 1835)               Jabier Mutton

## 2016-10-08 NOTE — Anesthesia Procedure Notes (Signed)
Procedure Name: MAC Date/Time: 10/08/2016 11:52 AM Performed by: Reha Martinovich, Sheron Nightingale Pre-anesthesia Checklist: Patient identified, Emergency Drugs available, Suction available, Patient being monitored and Timeout performed Patient Re-evaluated:Patient Re-evaluated prior to induction

## 2016-10-09 LAB — GLUCOSE, CAPILLARY
GLUCOSE-CAPILLARY: 101 mg/dL — AB (ref 65–99)
GLUCOSE-CAPILLARY: 75 mg/dL (ref 65–99)
Glucose-Capillary: 83 mg/dL (ref 65–99)

## 2016-10-09 LAB — CBC
HCT: 27.5 % — ABNORMAL LOW (ref 36.0–46.0)
HEMOGLOBIN: 9.6 g/dL — AB (ref 12.0–15.0)
MCH: 29.4 pg (ref 26.0–34.0)
MCHC: 34.9 g/dL (ref 30.0–36.0)
MCV: 84.1 fL (ref 78.0–100.0)
Platelets: 103 10*3/uL — ABNORMAL LOW (ref 150–400)
RBC: 3.27 MIL/uL — ABNORMAL LOW (ref 3.87–5.11)
RDW: 15.4 % (ref 11.5–15.5)
WBC: 12 10*3/uL — ABNORMAL HIGH (ref 4.0–10.5)

## 2016-10-09 MED ORDER — OXYCODONE HCL 5 MG PO TABS: 5.0000 mg | ORAL_TABLET | ORAL | 0 refills | Status: DC | PRN

## 2016-10-09 NOTE — Progress Notes (Signed)
Spoke with patient over the phone regarding insulin pump and glycemic control. Per the chart, noted patient had resumed insulin pump yesterday evening. Patient states that she reconnected insulin pump around 6:15 pm on 10/08/16 and she decreased her basal rate from 4 to 2 units/hour. Patient checked glucose with her own glucose meter and CBGs were also checked with hospital glucometer throughout the night and morning. Patient states that she checked her glucose at 6:30 pm on 10/08/16 and it was 119 mg/dl and she bloused a total of 2.4 units for correction and 13 units for carbs to cover supper. Glucose was 136 mg/dl at 10:30 pm on 1/22, 83 mg/dl at 2:40 am, and down to 51 mg/dl at 4:34 am this morning. Patient states that she suspended her insulin pump at 4:34 and it has been suspended since then. Discussed insulin changes after delivery and with breast feeding. Encouraged patient to reach out to Dr. Buddy Duty for recommendations for further changes with insulin pump. Encouraged patient to continue to monitor glucose closely and if she experiences any other issues with hypoglycemia to be sure to call Dr. Cindra Eves office for recommendations or to see if her appointment can be moved up. Patient verbalized understanding of information and states that she has no further questions regarding DM at this time.   Thanks, Barnie Alderman, RN, MSN, CDE Diabetes Coordinator Inpatient Diabetes Program [phone] (Team Pager from 8am to 5pm)

## 2016-10-09 NOTE — Plan of Care (Signed)
Problem: Urinary Elimination: Goal: Ability to reestablish a normal urinary elimination pattern will improve Outcome: Not Progressing Patient complains of urinary frequency, feeling like she is not emptying her bladder. Dr. Si Raider informed of recent temperatures, patient's complaint, and recent fundal check at 2 FB below the umbilicus after voiding. Order received to scan the bladder if patient continues to complain. I have encouraged patient to increase fluid intake and she has been compliant in doing so. Plan to monitor temperature also.

## 2016-10-09 NOTE — Lactation Note (Signed)
This note was copied from a baby's chart. Lactation Consultation Note  Patient Name: Boy Zong Mogel [id number] Date: 10/09/2016 Reason for consult: Follow-up assessment   Follow up with mom of 25 hour old infant. Infant was latched and BF when I entered the room, he was positioned well and feeding intermittently. Enc mom to use awakening techniques as needed and compress/massage breast with feeding. Mom was concerned about infant sleepiness as this is more prevelant today. Discussed this can be a normal occurrence as long as infant is feeding and weight and output is good. Discussed importance of awakening techniques. Infant may be d/c home today after bili check later this afternoon.    Mom asked to begin pumping. She reports she wants to BF this infant longer than her others. She reports she usually gives up when she becomes engorged. Set up DEBP, instructions for pumping, assembling, disassembling, and cleaning of pump parts discussed with parents. Mom pumped and obtained 10 cc EBM. We worked on hand expression and mom was able to hand express colostrum. Enc mom to feed infant EBM after next BF. Enc mom to pump every 3 hours post BF and to offer the infant supplement of EBM after BF. Mom was pleased to see colostrum. She reports she has felt some lumpiness today that resolves with feeding. She is not engorged at this time. Mom has ordered a PIS for home use.   Discussed BF Basics, positioning, I/O, Engorgement prevention/treatment, and breast milk storage and handling. Offered mom and OP LC appt and she agreed, scheduled for 1/29 @ 1:30 PM, appt reminder given. Report to [facility], Therapist, sports. Informed Butch Penny that mom will need to be shown how to spoon or finger fed since infant is currently asleep. Discussed with mom that a bottle may be more effective as the volumes infant is taking increases. Mom voiced understanding.   [facility] Brochure reviewed, mom aware of OP Services, BF Support Groups and [place] phone #.  Enc mom to call [facility] dept with any questions/concerns. Mom is a [facility] client and is aware to call and make and appt.        Maternal Data Formula Feeding for Exclusion: No Has patient been taught Hand Expression?: Yes Does the patient have breastfeeding experience prior to this delivery?: Yes  Feeding    LATCH Score/Interventions Latch: Repeated attempts needed to sustain latch, nipple held in mouth throughout feeding, stimulation needed to elicit sucking reflex. Intervention(s): Waking techniques;Teach feeding cues;Skin to skin Intervention(s): Adjust position;Assist with latch;Breast massage;Breast compression  Audible Swallowing: Spontaneous and intermittent  Type of Nipple: Everted at rest and after stimulation  Comfort (Breast/Nipple): Filling, red/small blisters or bruises, mild/mod discomfort  Problem noted: Mild/Moderate discomfort  Hold (Positioning): No assistance needed to correctly position infant at breast. Intervention(s): Breastfeeding basics reviewed;Support Pillows;Position options;Skin to skin  LATCH Score: 8  Lactation Tools Discussed/Used WIC Program: Yes Pump Review: Setup, frequency, and cleaning;Milk Storage Initiated by:: Nonah Mattes, RN, IBCLC Date initiated:: 10/09/16   Consult Status Consult Status: Follow-up Date: 10/15/16 Follow-up type: Out-patient    Debby Freiberg Shatia Sindoni 10/09/2016, 12:53 PM

## 2016-10-09 NOTE — Discharge Summary (Signed)
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 yo [phone] at [redacted]w[redacted]d was admitted to the hospital 10/07/2016 for induction of labor.  Indication for induction: Preeclampsia and IDDM.  Patient had an uncomplicated labor course as follows: Membrane Rupture Time/Date: 12:11 PM ,10/07/2016   Intrapartum Procedures: Episiotomy: None [1]                                         Lacerations:  None [1]  Patient had delivery of a Viable infant.  Information for the patient's newborn:  Ismahan, Kumpf [id number]  Delivery Method: Vaginal, Spontaneous Delivery (Filed from Delivery Summary)   10/07/2016  Details of delivery can be found in separate delivery note.  Patient had a routine postpartum course. Patient is discharged home 10/09/16.  Pt will continue with insulin pump at 2 until see endocrinologist next week.    Physical exam  Vitals:   10/08/16 1835 10/08/16 1930 10/08/16 2330 10/09/16 0330  BP: 113/64 135/67 139/67 132/72  Pulse: 94 92 91 95  Resp: 18 18 18 18   Temp: 98.2 F (36.8 C) 98.5 F (36.9 C) 98.1 F (36.7 C) 98.2 F (36.8 C)  TempSrc: Oral Oral Axillary Oral  SpO2: 99% 100% 100% 100%  Weight:      Height:        General: alert, cooperative and no distress Lochia: appropriate Uterine Fundus: firm Incision: Healing well with no significant drainage DVT Evaluation: No evidence of DVT seen on physical exam. Negative Homan's sign. Labs: Lab Results  Component Value Date   WBC 12.0 (H) 10/09/2016   HGB 9.6 (L) 10/09/2016   HCT 27.5 (L) 10/09/2016   MCV 84.1 10/09/2016   PLT 103 (L) 10/09/2016   CMP Latest Ref Rng & Units 10/07/2016  Glucose 65 - 99 mg/dL 176(H)  BUN 6 - 20 mg/dL 14  Creatinine 0.44 - 1.00 mg/dL 0.54  Sodium 135 - 145 mmol/L 134(L)  Potassium 3.5 - 5.1 mmol/L 3.8  Chloride 101 - 111 mmol/L 106  CO2 22 - 32 mmol/L 20(L)  Calcium 8.9 - 10.3 mg/dL 8.8(L)  Total Protein 6.5 - 8.1 g/dL 6.4(L)  Total Bilirubin 0.3 - 1.2 mg/dL 0.7  Alkaline Phos 38 -  126 U/L 181(H)  AST 15 - 41 U/L 26  ALT 14 - 54 U/L 16    Discharge instruction: per After Visit Summary and "Baby and Me Booklet".  After visit meds:  Allergies as of 10/09/2016      Reactions   Sulfa Antibiotics Anaphylaxis      Medication List    STOP taking these medications   NOVOLIN N 100 UNIT/ML injection Generic drug:  insulin NPH Human     TAKE these medications   oxyCODONE 5 MG immediate release tablet Commonly known as:  Oxy IR/ROXICODONE Take 1 tablet (5 mg total) by mouth every 4 (four) hours as needed (pain scale 4-7).   prenatal multivitamin Tabs tablet Take 1 tablet by mouth daily at 12 noon.     Tylenol and Ibuporfen, insulin pump at rate of 2  Diet: routine diet  Activity: Advance as tolerated. Pelvic rest for 6 weeks.   Outpatient follow up:6 weeks Follow up Appt:No future appointments. Follow up Visit:No Follow-up on file.  Postpartum contraception: Tubal Ligation  Newborn Data: Live born female  Birth Weight: 6 lb 15.5 oz (3161 g) APGAR: 9, 9  Baby Feeding: Breast Disposition:home with mother   10/09/2016 Starla Link, CNM

## 2016-10-10 ENCOUNTER — Ambulatory Visit: Payer: Self-pay

## 2016-10-10 ENCOUNTER — Encounter (HOSPITAL_COMMUNITY): Payer: Self-pay | Admitting: Obstetrics and Gynecology

## 2016-10-10 NOTE — Lactation Note (Signed)
This note was copied from a baby's chart. Lactation Consultation Note  Patient Name: [name] [id number] Date: 10/10/2016 Reason for consult: Follow-up assessment;Hyperbilirubinemia   Follow up with mom of 39 hour old infant. Infantn with 7 BF for 15-20 minutes, EBM x 5 of 10-35 cc via bottle, 3 voids and 5 stools in last 24 hours. Infant is under single phototherapy. LATCH Scores 8. Infant currently asleep.   Mom reports infant is having more difficulty with latching last night. She has been pumping and supplementing with EBM. Mom is feeling fuller today. She is not engorged at this time. We discussed engorgement prevention/treatment. Discussed offering breast with each feeding, and then following with pumping to offer infant supplement. Mom is using bottle to supplement infant.   Mom has ordered a PIS that should be delivered today. She is a [facility] client and was offered a [place] before d/c if her pump does not arrive. Enc mom to call out for feeding assistance or if pump rental needed if infant is going home. Mom without further questions/concerns at this time.    Maternal Data Formula Feeding for Exclusion: No Has patient been taught Hand Expression?: Yes Does the patient have breastfeeding experience prior to this delivery?: Yes  Feeding    LATCH Score/Interventions                      Lactation Tools Discussed/Used WIC Program: Yes Pump Review: Setup, frequency, and cleaning;Milk Storage Initiated by:: Reviewed   Consult Status Consult Status: Follow-up Date: 10/15/16 Follow-up type: Out-patient    Debby Freiberg Aleena Kirkeby 10/10/2016, 11:11 AM

## 2016-10-10 NOTE — Addendum Note (Signed)
Addendum  created 10/10/16 1414 by Asher Muir, CRNA   Charge Capture section accepted

## 2016-10-11 ENCOUNTER — Ambulatory Visit: Payer: Self-pay

## 2016-10-11 NOTE — Lactation Note (Signed)
This note was copied from a baby's chart. Lactation Consultation Note: Mom has been pumping and bottle feeding EBM. Reports baby has not been latching well but she plans to continue trying when she gets home. Reports pumping has been going well. States she has ordered DEBP and it should be at home now. Plans to check and make sure it is there before leaving hospital. Wants [facility] loaner if pump not there. To page if needs pump. No questions at present.   Patient Name: [name] [id number] Date: 10/11/2016 Reason for consult: Follow-up assessment   Maternal Data    Feeding    LATCH Score/Interventions                      Lactation Tools Discussed/Used     Consult Status Consult Status: Complete    Truddie Crumble 10/11/2016, 11:04 AM

## 2016-10-15 ENCOUNTER — Ambulatory Visit: Payer: Self-pay

## 2016-10-15 NOTE — Lactation Note (Signed)
This note was copied from a baby's chart. Lactation Consult  Mother's reason for visit: to help with latching-        Visit Type:  outpatient Appointment Notes:  See below Consult:  Initial Lactation Consultant:  Broadus John  ________________________________________________________________________ [name] Name:  [name] Date of Birth:  04-12-1976 Pediatrician:  Maisie Fus Gender:  female Gestational Age: 40 weeks Birth Weight:  6 lbs 15.5 oz  Weight at Discharge:  Weight:6 lbs 10.7 oz                   Date of Discharge:  10/09/2016    Filed Weights   10/07/16 0053  Weight: 3136 oz  Last weight taken from location outside of [facility]:  6lbs 10oz 1/28   Location:Pediatrician  Weight today:  6 lbs 10.2     ________________________________________________________________________  Mother's Name: [name] Type of delivery:  Vaginal, Spontaneous Delivery Breastfeeding Experience:  None (did not attempt with 3 other children ages 6-22) Maternal Medical Conditions:  Diabetes Type 1, pre-eclampsia Maternal Medications:  Insulin  (Mom states baby was fed about an hour prior to appointment, 1/2 amount of feeding by bottle, and Mom pumped as well.  When baby on the scale for initial weight, milk was dribbling out of his mouth.  Baby not acting hungry during appt)  Mom here today with 104 day old baby, to get help with latching baby to the breast.  Baby has been bottle feeding, and Mom pumping 4-5 times per day using a Medela Pump in Style.  Mom obtains about 6 oz per pumping.  Baby born at 28 weeks, jaundice and phototherapy while in hospital.  Mom has large breasts, with very short shafted nipples.  Baby remains at discharge weight from 4 days ago.  Baby unable to latch deeply onto left breast after several tries, initiated first a 20 mm, then switched to 24 mm to determine if this would assist with baby opening wider and latching deeper onto breast.  It  seemed to help.  Assisted Mom with positioning and using alternate breast compression to increase milk transfer.  Taught FOB how to assess lower lip, and pull on chin to untuck as well as open mouth wider onto areola.  Baby was feeding very sleepily.  12 ml transferred  Mom's right breast much more full.  States she has more milk in right breast.  In football hold, baby was able to latch WITHOUT a nipple shield.  Mom latched him herself.  Baby sleepy, but nutritive.  Identified suck/swallow pattern.  Mom using alternate breast compression.  Baby transferred more on right without nipple shield. 20 ml.  Plan- 1- Breastfeed skin to skin using nipple shield if needed to maintain a deep areolar latch, goal of 8-12 feedings per 24 hrs -no pinching/pain should be felt, and offer both breasts at each feeding 2-Offer baby 30-45 ml after breastfeeding by bottle using the Pace Method and slow flow nipple 3- pump both breasts 15-20 mins (goal is >8 times in 24 hrs) 4- Follow-up with Lactation 09/21/16 @ 1:30pm 5- Call for any concerns  _______________________________________________________________________  Breastfeeding History (Post Discharge)  Frequency of breastfeeding:  None    Duration of feeding:  N/A  Supplementation  Formula:  Volume 60 ml Frequency:  4 times a day Total volume per day:  240 ml       Brand: Similac  Breastmilk:  Volume 60 ml Frequency:  4 times a day Total volume  per day: 240 ml  Method:  Bottle,   Pumping  Type of pump:  Medela pump in style Frequency:  4 times a day, none at night Volume:  200 ml  Infant Intake and Output Assessment  Voids:  12-15 in 24 hrs.  Color:  Clear yellow Stools:  12-15 in 24 hrs.  Color:  Yellow  ________________________________________________________________________  Maternal Breast Assessment  Breast:  Full Nipple:  Erect Pain level:  0   _______________________________________________________________________ Feeding  Assessment/Evaluation  Initial feeding assessment:  Infant's oral assessment:  WNL  Positioning:  Football Left breast  LATCH documentation:  Latch:  1 = Repeated attempts needed to sustain latch, nipple held in mouth throughout feeding, stimulation needed to elicit sucking reflex.  Audible swallowing:  1 = A few with stimulation  Type of nipple:  2 = Everted at rest and after stimulation short shafted  Comfort (Breast/Nipple):  2 = Soft / non-tender  Hold (Positioning):  1 = Assistance needed to correctly position infant at breast and maintain latch  LATCH score:  8  Attached assessment:  Shallow, switched to 24 mm nipple shield and baby latched somewhat deeper  Lips flanged:  Yes.    Lips untucked:  Yes.    Suck assessment:  Displays both  Tools:  Nipple shield 20 mm but switch to 24 mm Instructed on use and cleaning of tool:  Yes.    Pre-feed weight:  3010 g  Post-feed weight:  3022 g  Amount transferred:  12 ml  Additional Feeding Assessment -   Infant's oral assessment:  WNL  Positioning:  Football Right breast  LATCH documentation:  Latch:  2 = Grasps breast easily, tongue down, lips flanged, rhythmical sucking.  Audible swallowing:  2 = Spontaneous and intermittent  Type of nipple:  2 = Everted at rest and after stimulation  Comfort (Breast/Nipple):  1 = Filling, red/small blisters or bruises, mild/mod discomfort  Hold (Positioning):  2 = No assistance needed to correctly position infant at breast  LATCH score:  9  Attached assessment:  Deep  Lips flanged:  Yes.     Suck assessment:  Displays both  Pre-feed weight:  3022g   Post-feed weight: 3042g  Amount transferred:  20 ml Amount supplemented:  30 ml   Total amount pumped post feed: Mom did not pump.    Total amount transferred:  32 ml Total supplement given: 30 ml

## 2016-11-13 ENCOUNTER — Encounter: Payer: Self-pay | Admitting: Podiatry

## 2016-11-13 ENCOUNTER — Ambulatory Visit (INDEPENDENT_AMBULATORY_CARE_PROVIDER_SITE_OTHER): Payer: 59 | Admitting: Podiatry

## 2016-11-13 VITALS — BP 144/94 | HR 93 | Ht 61.0 in | Wt 175.0 lb

## 2016-11-13 DIAGNOSIS — M2012 Hallux valgus (acquired), left foot: Secondary | ICD-10-CM | POA: Diagnosis not present

## 2016-11-13 DIAGNOSIS — R52 Pain, unspecified: Secondary | ICD-10-CM | POA: Diagnosis not present

## 2016-11-13 NOTE — Progress Notes (Signed)
   Subjective:    Patient ID: [name], female    DOB: 10-16-1975, 41 y.o.   MRN: [id number]  HPI   This patient today with 2 problems. She describes an approximately two-month history of right heel pain that was activated in a seated position when she maximally dorsiflexed or feet. She denies any discomfort with weightbearing. She says gradually the symptoms have resolved over this two-month period She is complaining also approximately two-month history of intermittent sharp/burning/dull pain in or around her medial left hallux primarily nonweightbearing occasionally weightbearing. The symptoms are intermittent and lasts for several minutes and then seemed to dissipate. She also mentions some occasional numbness in her third left toe. Patient is not any specific treatment or professional care. The symphysis around the left hallux of also gradually improved over time Patient has history of recent birthing in January  Patient is diabetic denies any history of foot ulceration, claudication or amputation Patient is a current smoker approximately one half pack cigarettes daily Patient works as Art gallery manager     Review of Systems  Constitutional: Positive for activity change, appetite change and unexpected weight change.       Objective:   Physical Exam She is proximally 5 foot 1 inche and weighs approximately 175 pounds  Orientated 3  Vascular: DP and PT pulses 2/4 bilaterally Capillary reflex immediate bilaterally  Neurological: Sensation to 10 g monofilament wire intact 5/5 bilaterally Vibratory sensation reactive bilaterally Ankle reflex equal and reactive bilaterally  Dermatological: No open skin lesions bilaterally Texture and turgor within normal limits bilaterally  Musculoskeletal: HAV bilaterally Manual motor testing dorsi flexion, plantar flexion, inversion, eversion 5/5 bilaterally Weightbearing has stable gait Patient able to easily stand  on her toes There is no palpable tenderness in the fascial band in the right heel Not able to duplicate any discomfort to palpation in or around the left great toe joint or distally along the medial left hallux       Assessment & Plan:   Assessment: Diabetic with satisfactory neurovascular status Resolve plantar fasciitis right 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 At this time as the symptoms seem to be improving will recommend observation of the patient and return if the symptoms progress or worsen over time  Reappoint at patient's request

## 2016-11-13 NOTE — Patient Instructions (Signed)
Today your diabetic foot screen was within normal limits The occasional sharp burning pain on the inside of your left big toe may be associated with a bunion deformity causing excessive pressure and irritation to the local nerve Return if the symptoms worsen over time  Diabetes and Foot Care Diabetes may cause you to have problems because of poor blood supply (circulation) to your feet and legs. This may cause the skin on your feet to become thinner, break easier, and heal more slowly. Your skin may become dry, and the skin may peel and crack. You may also have nerve damage in your legs and feet causing decreased feeling in them. You may not notice minor injuries to your feet that could lead to infections or more serious problems. Taking care of your feet is one of the most important things you can do for yourself. Follow these instructions at home:  Wear shoes at all times, even in the house. Do not go barefoot. Bare feet are easily injured.  Check your feet daily for blisters, cuts, and redness. If you cannot see the bottom of your feet, use a mirror or ask someone for help.  Wash your feet with warm water (do not use hot water) and mild soap. Then pat your feet and the areas between your toes until they are completely dry. Do not soak your feet as this can dry your skin.  Apply a moisturizing lotion or petroleum jelly (that does not contain alcohol and is unscented) to the skin on your feet and to dry, brittle toenails. Do not apply lotion between your toes.  Trim your toenails straight across. Do not dig under them or around the cuticle. File the edges of your nails with an emery board or nail file.  Do not cut corns or calluses or try to remove them with medicine.  Wear clean socks or stockings every day. Make sure they are not too tight. Do not wear knee-high stockings since they may decrease blood flow to your legs.  Wear shoes that fit properly and have enough cushioning. To break in  new shoes, wear them for just a few hours a day. This prevents you from injuring your feet. Always look in your shoes before you put them on to be sure there are no objects inside.  Do not cross your legs. This may decrease the blood flow to your feet.  If you find a minor scrape, cut, or break in the skin on your feet, keep it and the skin around it clean and dry. These areas may be cleansed with mild soap and water. Do not cleanse the area with peroxide, alcohol, or iodine.  When you remove an adhesive bandage, be sure not to damage the skin around it.  If you have a wound, look at it several times a day to make sure it is healing.  Do not use heating pads or hot water bottles. They may burn your skin. If you have lost feeling in your feet or legs, you may not know it is happening until it is too late.  Make sure your health care provider performs a complete foot exam at least annually or more often if you have foot problems. Report any cuts, sores, or bruises to your health care provider immediately. Contact a health care provider if:  You have an injury that is not healing.  You have cuts or breaks in the skin.  You have an ingrown nail.  You notice redness on your legs  or feet.  You feel burning or tingling in your legs or feet.  You have pain or cramps in your legs and feet.  Your legs or feet are numb.  Your feet always feel cold. Get help right away if:  There is increasing redness, swelling, or pain in or around a wound.  There is a red line that goes up your leg.  Pus is coming from a wound.  You develop a fever or as directed by your health care provider.  You notice a bad smell coming from an ulcer or wound. This information is not intended to replace advice given to you by your health care provider. Make sure you discuss any questions you have with your health care provider. Document Released: 08/31/2000 Document Revised: 02/09/2016 Document Reviewed:  02/10/2013 Elsevier Interactive Patient Education  2017 [employer].

## 2017-01-01 ENCOUNTER — Ambulatory Visit (INDEPENDENT_AMBULATORY_CARE_PROVIDER_SITE_OTHER): Payer: 59 | Admitting: Neurology

## 2017-01-01 ENCOUNTER — Other Ambulatory Visit: Payer: Self-pay | Admitting: *Deleted

## 2017-01-01 DIAGNOSIS — R200 Anesthesia of skin: Secondary | ICD-10-CM

## 2017-01-01 DIAGNOSIS — G5603 Carpal tunnel syndrome, bilateral upper limbs: Secondary | ICD-10-CM

## 2017-01-01 NOTE — Procedures (Signed)
[facility]  [place], [address]  [place], [place] [address] Tel: [phone] Fax:  [phone] Test Date:  01/01/2017  Patient: [name] DOB: [birth year] Physician: Narda Amber, DO  Sex: Female Height: 5\' 1"  Ref Phys: Kathryne Hitch, M.D.  ID#: [id number] Temp: 35.2C Technician:    Patient Complaints: This is a 41 year-old female with prior history of CTS release bilaterally is referred for evaluation of wrist pain and hand paresthesias, worse on the left.  NCV & EMG Findings: Extensive electrodiagnostic testing of the left upper extremity and additional studies of the right shows:  1. Bilateral sensory responses show prolonged latency (L4.1, R3.6 ms) and reduced amplitude (L13.0, R19.7 V).  Bilateral ulnar sensory responses are within normal limits. 2. Bilateral median and ulnar motor responses are within normal limits. 3. There is no evidence of active or chronic motor axon loss changes affecting any of the tested muscles. Motor unit configuration and recruitment pattern is within normal limits.  Impression: Bilateral median neuropathy at or distal to the wrist, consistent with carpal tunnel syndrome. Overall, these findings are moderate in degree. Given the patient's history, these findings are most likely residuals from her previously treated carpal tunnel syndrome.    ___________________________ Narda Amber, DO    Nerve Conduction Studies Anti Sensory Summary Table   Site NR Peak (ms) Norm Peak (ms) P-T Amp (V) Norm P-T Amp  Left Median Anti Sensory (2nd Digit)  Wrist    4.1 <3.4 13.0 >20  Right Median Anti Sensory (2nd Digit)  Wrist    3.6 <3.4 19.7 >20  Left Ulnar Anti Sensory (5th Digit)  Wrist    2.5 <3.1 29.0 >12  Right Ulnar Anti Sensory (5th Digit)  Wrist    2.4 <3.1 20.6 >12   Motor Summary Table   Site NR Onset (ms) Norm Onset (ms) O-P Amp (mV) Norm O-P Amp Site1 Site2 Delta-0 (ms) Dist (cm) Vel (m/s) Norm Vel (m/s)  Left  Median Motor (Abd Poll Brev)  Wrist    3.6 <4.0 8.2 >6 Elbow Wrist 3.5 21.0 60 >50  Elbow    7.1  8.0         Right Median Motor (Abd Poll Brev)  Wrist    3.8 <3.9 8.8 >6 Elbow Wrist 4.3 24.0 56 >50  Elbow    8.1  8.6         Left Ulnar Motor (Abd Dig Minimi)  Wrist    1.3 <3.1 7.6 >7 B Elbow Wrist 4.5 24.0 53 >50  B Elbow    5.8  7.0  A Elbow B Elbow 2.1 11.0 52 >50  A Elbow    7.9  6.7         Right Ulnar Motor (Abd Dig Minimi)  Wrist    2.0 <3.1 8.2 >7 B Elbow Wrist 3.5 20.0 57 >50  B Elbow    5.5  7.8  A Elbow B Elbow 1.7 10.0 59 >50  A Elbow    7.2  7.0          EMG   Side Muscle Ins Act Fibs Psw Fasc Number Recrt Dur Dur. Amp Amp. Poly Poly. Comment  Right 1stDorInt Nml Nml Nml Nml Nml Nml Nml Nml Nml Nml Nml Nml N/A  Right Abd Poll Brev Nml Nml Nml Nml Nml Nml Nml Nml Nml Nml Nml Nml N/A  Right Ext Indicis Nml Nml Nml Nml Nml Nml Nml Nml Nml Nml Nml Nml N/A  Right PronatorTeres  Nml Nml Nml Nml Nml Nml Nml Nml Nml Nml Nml Nml N/A  Right Biceps Nml Nml Nml Nml Nml Nml Nml Nml Nml Nml Nml Nml N/A  Right Triceps Nml Nml Nml Nml Nml Nml Nml Nml Nml Nml Nml Nml N/A  Right Deltoid Nml Nml Nml Nml Nml Nml Nml Nml Nml Nml Nml Nml N/A  Left 1stDorInt Nml Nml Nml Nml Nml Nml Nml Nml Nml Nml Nml Nml N/A  Left Abd Poll Brev Nml Nml Nml Nml Nml Nml Nml Nml Nml Nml Nml Nml N/A  Left Ext Indicis Nml Nml Nml Nml Nml Nml Nml Nml Nml Nml Nml Nml N/A  Left PronatorTeres Nml Nml Nml Nml Nml Nml Nml Nml Nml Nml Nml Nml N/A  Left Biceps Nml Nml Nml Nml Nml Nml Nml Nml Nml Nml Nml Nml N/A  Left Triceps Nml Nml Nml Nml Nml Nml Nml Nml Nml Nml Nml Nml N/A  Left Deltoid Nml Nml Nml Nml Nml Nml Nml Nml Nml Nml Nml Nml N/A      Waveforms:

## 2017-01-04 ENCOUNTER — Other Ambulatory Visit (HOSPITAL_COMMUNITY): Payer: Self-pay | Admitting: Obstetrics and Gynecology

## 2017-01-04 DIAGNOSIS — Z5189 Encounter for other specified aftercare: Secondary | ICD-10-CM

## 2017-01-04 DIAGNOSIS — N736 Female pelvic peritoneal adhesions (postinfective): Secondary | ICD-10-CM

## 2017-01-11 ENCOUNTER — Ambulatory Visit (HOSPITAL_COMMUNITY): Payer: 59

## 2017-01-15 ENCOUNTER — Ambulatory Visit (HOSPITAL_COMMUNITY): Payer: 59

## 2017-01-23 ENCOUNTER — Ambulatory Visit (HOSPITAL_COMMUNITY)
Admission: RE | Admit: 2017-01-23 | Discharge: 2017-01-23 | Disposition: A | Discharge status: Home / Self Care | Payer: 59 | Source: Ambulatory Visit | Attending: Obstetrics and Gynecology | Admitting: Obstetrics and Gynecology

## 2017-01-23 ENCOUNTER — Encounter (HOSPITAL_COMMUNITY): Payer: Self-pay | Admitting: Radiology

## 2017-01-23 DIAGNOSIS — Z975 Presence of (intrauterine) contraceptive device: Secondary | ICD-10-CM | POA: Insufficient documentation

## 2017-01-23 DIAGNOSIS — Z5189 Encounter for other specified aftercare: Secondary | ICD-10-CM | POA: Insufficient documentation

## 2017-01-23 DIAGNOSIS — N971 Female infertility of tubal origin: Secondary | ICD-10-CM | POA: Diagnosis not present

## 2017-01-23 DIAGNOSIS — N736 Female pelvic peritoneal adhesions (postinfective): Secondary | ICD-10-CM

## 2017-01-23 MED ORDER — IOPAMIDOL (ISOVUE-300) INJECTION 61%
30.0000 mL | Freq: Once | INTRAVENOUS | Status: AC | PRN
  Administered 2017-01-23: 30 mL

## 2017-01-24 NOTE — Procedures (Signed)
Indication: s/p left partial salpingectomy with unidentifiable right fallopian tube due to scar tissue.  Remote h/o tubal sterilization with reversal.  Procedure: Speculum was placed in vagina, tenaculum placed on anterior lip of cervix and cervix prepped with betadine.  Tip balloon insufflated with 3cc of air and speculum and tenaculum removed.  Approx. 30cc of contrast dye was injected into uterus and images taken.  Catheter was removed.  Pt tolerated the procedure well.

## 2017-03-28 NOTE — Addendum Note (Signed)
Addendum  created 03/28/17 1532 by Josephine Igo, MD   Sign clinical note

## 2017-03-28 NOTE — Anesthesia Postprocedure Evaluation (Signed)
Anesthesia Post Note  Patient: [name]  Procedure(s) Performed: Procedure(s) (LRB): LEFT PARTIAL SALPINGECTOMY (Bilateral)     Anesthesia Post Evaluation  Last Vitals:  Vitals:   10/09/16 0330 10/09/16 0838  BP: 132/72 140/73  Pulse: 95 87  Resp: 18   Temp: 36.8 C 37 C    Last Pain:  Vitals:   10/09/16 1620  TempSrc:   PainSc: 3                  Raeonna Milo A.

## 2017-06-18 ENCOUNTER — Encounter (HOSPITAL_BASED_OUTPATIENT_CLINIC_OR_DEPARTMENT_OTHER): Payer: Self-pay | Admitting: *Deleted

## 2017-06-18 NOTE — Progress Notes (Signed)
Coming Tuesday for BMET and EKG.

## 2017-06-24 ENCOUNTER — Encounter (HOSPITAL_BASED_OUTPATIENT_CLINIC_OR_DEPARTMENT_OTHER)
Admission: RE | Admit: 2017-06-24 | Discharge: 2017-06-24 | Disposition: A | Discharge status: Home / Self Care | Payer: 59 | Source: Ambulatory Visit | Attending: Orthopedic Surgery | Admitting: Orthopedic Surgery

## 2017-06-24 ENCOUNTER — Other Ambulatory Visit: Payer: Self-pay

## 2017-06-24 DIAGNOSIS — E119 Type 2 diabetes mellitus without complications: Secondary | ICD-10-CM | POA: Diagnosis not present

## 2017-06-24 DIAGNOSIS — Z01818 Encounter for other preprocedural examination: Secondary | ICD-10-CM | POA: Insufficient documentation

## 2017-06-24 LAB — BASIC METABOLIC PANEL
Anion gap: 9 (ref 5–15)
BUN: 11 mg/dL (ref 6–20)
CALCIUM: 9.4 mg/dL (ref 8.9–10.3)
CHLORIDE: 101 mmol/L (ref 101–111)
CO2: 24 mmol/L (ref 22–32)
CREATININE: 0.56 mg/dL (ref 0.44–1.00)
Glucose, Bld: 201 mg/dL — ABNORMAL HIGH (ref 65–99)
Potassium: 4.8 mmol/L (ref 3.5–5.1)
SODIUM: 134 mmol/L — AB (ref 135–145)

## 2017-06-24 NOTE — Progress Notes (Signed)
Spoke With Elmyra Ricks PA explained pt here for EKG and Gwynne Edinger RN did EKG, notice dressing under right breast draining. Pt said she had been to [facility] to get wound packed. Gwynne Edinger Rn reinforced dressing because it had drained. Told Mendel Ryder results of culture of wound proteus mirabilis and Beta Hemolytic streptococcus, group C. Mendel Ryder spoke with Dr. Percell Miller and they are going to reschedule patient once her wound stops draining.

## 2017-06-27 ENCOUNTER — Ambulatory Visit (HOSPITAL_BASED_OUTPATIENT_CLINIC_OR_DEPARTMENT_OTHER): Admission: RE | Admit: 2017-06-27 | Payer: 59 | Source: Ambulatory Visit | Admitting: Orthopedic Surgery

## 2017-06-27 SURGERY — RELEASE, A1 PULLEY, FOR TRIGGER FINGER
Anesthesia: Choice | Site: Finger | Laterality: Right

## 2017-06-30 IMAGING — RF DG HYSTEROGRAM
9 series · 9 of 9 positions shown · non-contrast
Comparison: None.

CLINICAL DATA: Aftercare, female pelvic peritoneal adhesions

EXAM:
HYSTEROSALPINGOGRAM
TECHNIQUE: Hysterosalpingogram was performed by the ordering physician under
fluoroscopy. Fluoroscopic images were submitted for radiologic
interpretation following the procedure. Please see the procedural
report for the amount of contrast and the fluoroscopy time utilized.

[Series 1: run · 1 of 1 slices shown (1 of 9)]
[im 1/1]
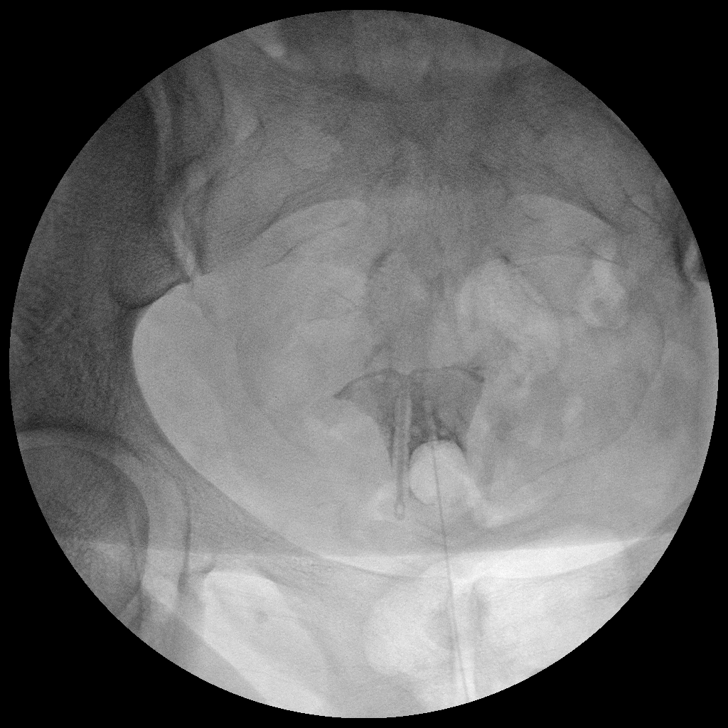

[Series 2: run · 1 of 1 slices shown (2 of 9)]
[im 1/1]
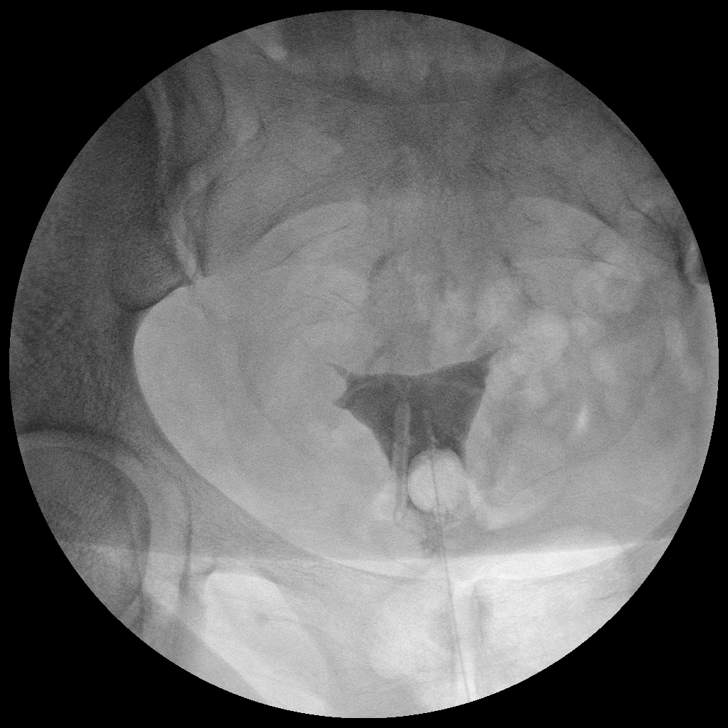

[Series 3: run · 1 of 1 slices shown (3 of 9)]
[im 1/1]
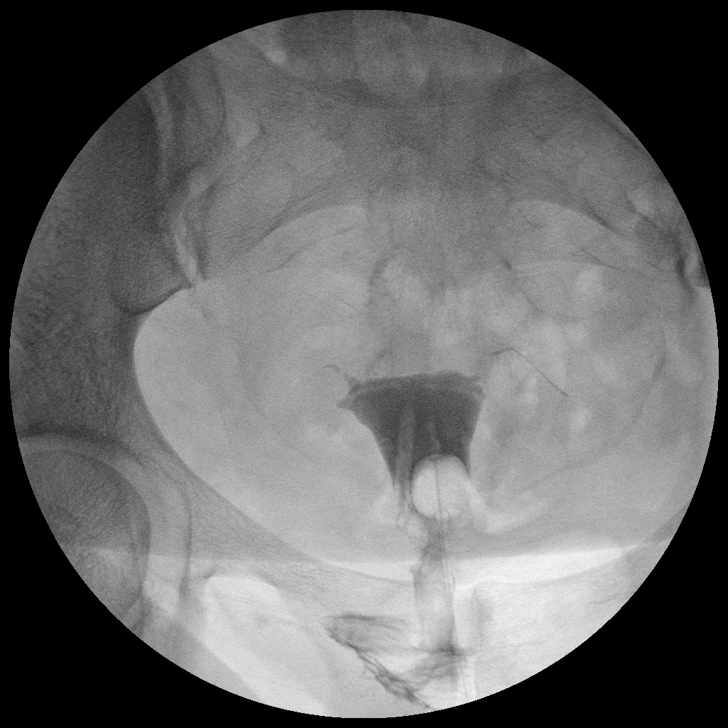

[Series 4: run · 1 of 1 slices shown (4 of 9)]
[im 1/1]
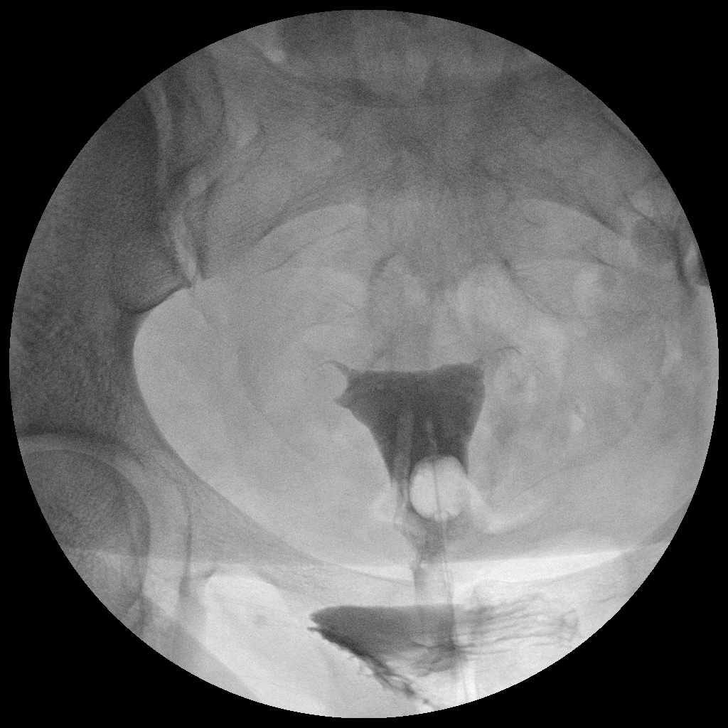

[Series 5: run · 1 of 1 slices shown (5 of 9)]
[im 1/1]
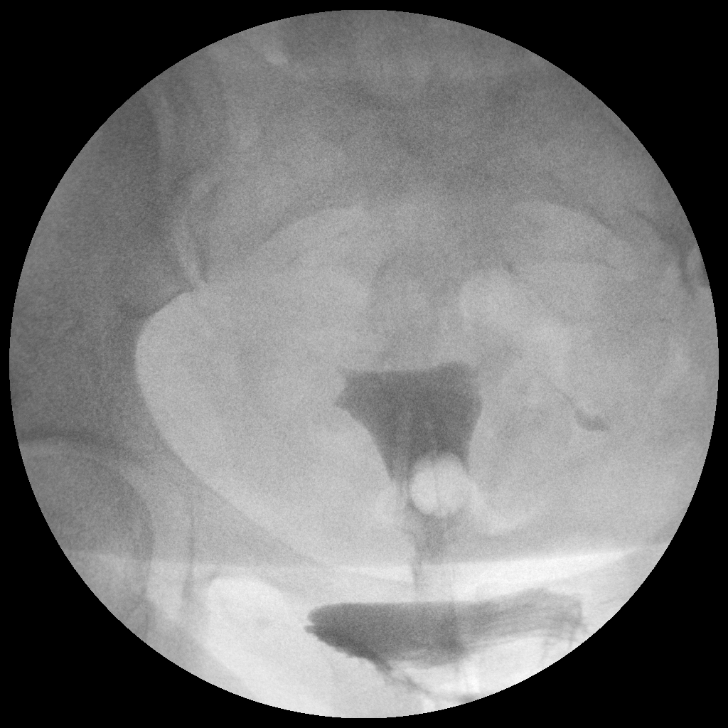

[Series 6: run · 1 of 1 slices shown (6 of 9)]
[im 1/1]
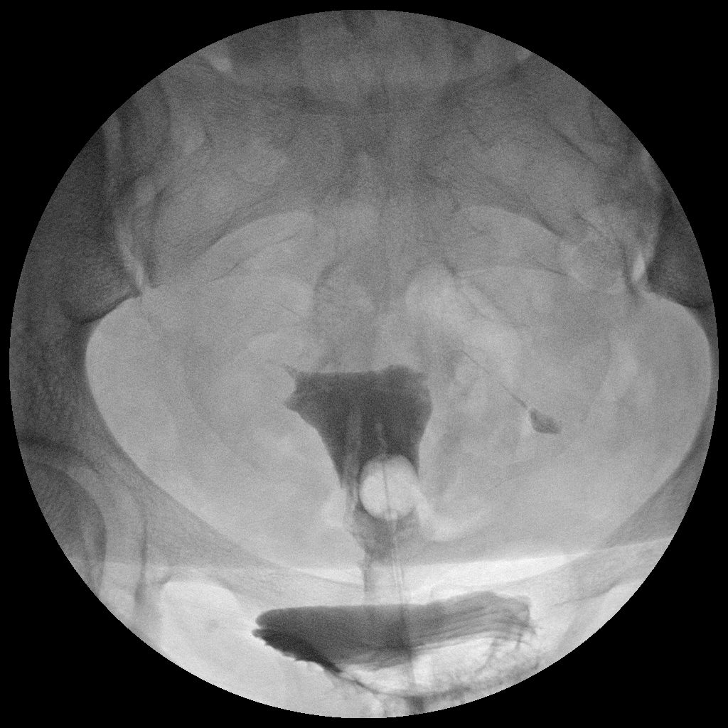

[Series 7: run · 1 of 1 slices shown (7 of 9)]
[im 1/1]
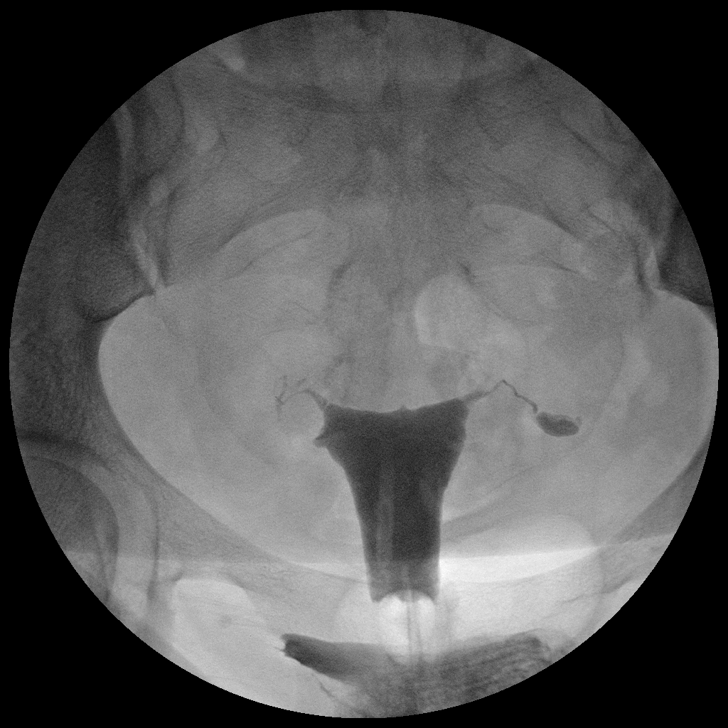

[Series 8: run · 1 of 1 slices shown (8 of 9)]
[im 1/1]
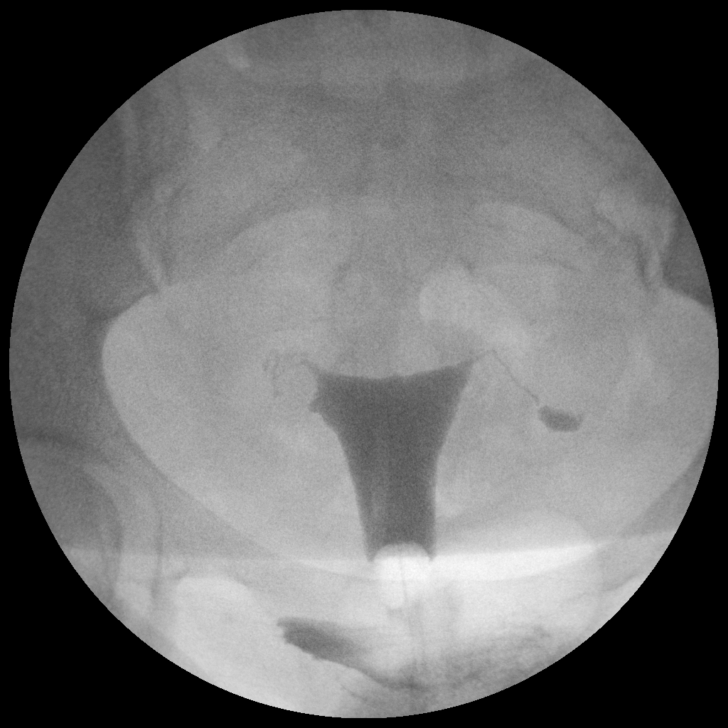

[Series 9: run · 1 of 1 slices shown (9 of 9)]
[im 1/1]
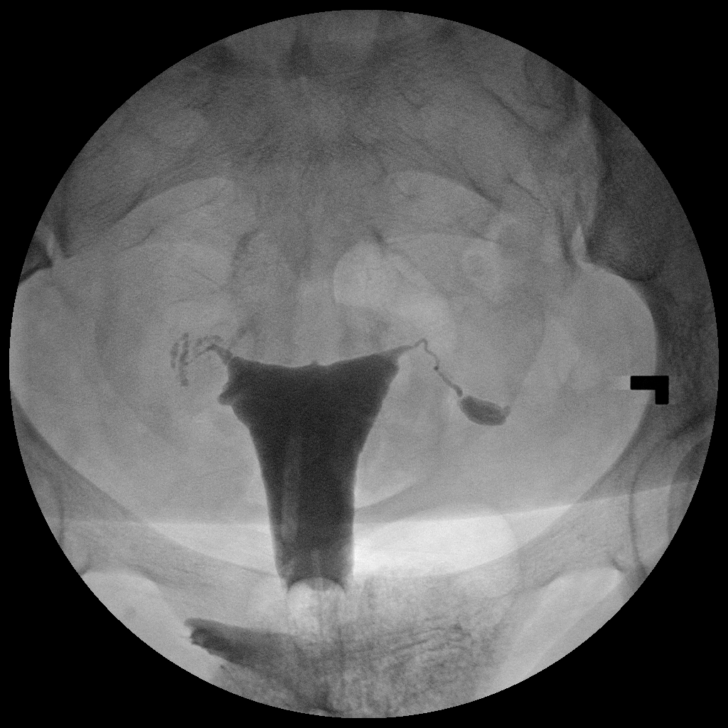

[9 of 9 positions shown; findings below may reference images not displayed]

FINDINGS: Multiple images are submitted from a hysterosalpingogram. These
demonstrate an IUD within the uterus. Abrupt termination of the
fallopian tubes bilaterally within the mid tubal region. Both tubes
are occluded.
IMPRESSION: Bilateral tubal occlusion in the midportion of the tubes.

IUD in place.

## 2017-07-08 NOTE — H&P (Signed)
[name] comes in for follow up.  Most troubling are worsening bilateral A1 triggering long finger, both hands.  Her carpal tunnel syndrome recurrent symptoms are there, but tolerable after previous release.  Her degree of tenosynovitis has settled down.  We have injected trigger fingers with only transient improvement.  She comes in to discuss definitive treatment with release.   History and general exam are reviewed.   EXAMINATION: Specifically, marked triggering A1 pulley, long finger both hands.  De Quervain's tenosynovitis is better.  She is neurovascularly intact.  Healed carpal tunnel incisions.  No atrophy.  No weakness.    DISPOSITION:  We are going to proceed with A1 pulley releases on both hands sequentially, long finger.  That procedure, risks, benefits and complications reviewed.  Paperwork complete.  All questions answered.  I have also given her a note to have a little more breaks at work where she does repetitive work to try to help with her symptoms in the interim.  I went over this with her and she

## 2017-07-09 ENCOUNTER — Encounter (HOSPITAL_BASED_OUTPATIENT_CLINIC_OR_DEPARTMENT_OTHER): Payer: Self-pay | Admitting: *Deleted

## 2017-07-10 NOTE — Anesthesia Preprocedure Evaluation (Signed)
Anesthesia Evaluation  Patient identified by MRN, date of birth, ID band Patient awake    Reviewed: Allergy & Precautions, H&P , NPO status , Patient's Chart, lab work & pertinent test results  Airway Mallampati: II  TM Distance: >3 FB Neck ROM: full    Dental no notable dental hx. (+) Teeth Intact   Pulmonary Current Smoker, former smoker,    Pulmonary exam normal breath sounds clear to auscultation       Cardiovascular hypertension, Normal cardiovascular exam Rhythm:Regular Rate:Normal     Neuro/Psych negative neurological ROS  negative psych ROS   GI/Hepatic negative GI ROS, Neg liver ROS,   Endo/Other  diabetes, Well Controlled, Gestational, Insulin DependentObesity  Renal/GU negative Renal ROS  negative genitourinary   Musculoskeletal negative musculoskeletal ROS (+)   Abdominal (+) + obese,   Peds  Hematology negative hematology ROS (+)   Anesthesia Other Findings   Reproductive/Obstetrics Desires Sterilization- for PPTL                             Anesthesia Physical  Anesthesia Plan  ASA: III  Anesthesia Plan: MAC   Post-op Pain Management:    Induction: Intravenous  PONV Risk Score and Plan: 2 and Ondansetron, Dexamethasone, Treatment may vary due to age or medical condition and Midazolam  Airway Management Planned: Mask and Natural Airway  Additional Equipment:   Intra-op Plan:   Post-operative Plan:   Informed Consent: I have reviewed the patients History and Physical, chart, labs and discussed the procedure including the risks, benefits and alternatives for the proposed anesthesia with the patient or authorized representative who has indicated his/her understanding and acceptance.     Plan Discussed with: CRNA, Anesthesiologist and Surgeon  Anesthesia Plan Comments:         Anesthesia Quick Evaluation

## 2017-07-11 ENCOUNTER — Ambulatory Visit (HOSPITAL_BASED_OUTPATIENT_CLINIC_OR_DEPARTMENT_OTHER): Payer: 59 | Admitting: Anesthesiology

## 2017-07-11 ENCOUNTER — Encounter (HOSPITAL_BASED_OUTPATIENT_CLINIC_OR_DEPARTMENT_OTHER): Payer: Self-pay | Admitting: Emergency Medicine

## 2017-07-11 ENCOUNTER — Ambulatory Visit (HOSPITAL_BASED_OUTPATIENT_CLINIC_OR_DEPARTMENT_OTHER)
Admission: RE | Admit: 2017-07-11 | Discharge: 2017-07-11 | Disposition: A | Discharge status: Home / Self Care | Payer: 59 | Source: Ambulatory Visit | Attending: Orthopedic Surgery | Admitting: Orthopedic Surgery

## 2017-07-11 ENCOUNTER — Encounter (HOSPITAL_BASED_OUTPATIENT_CLINIC_OR_DEPARTMENT_OTHER)
Admission: RE | Disposition: A | Discharge status: Home / Self Care | Payer: Self-pay | Source: Ambulatory Visit | Attending: Orthopedic Surgery

## 2017-07-11 DIAGNOSIS — Z6835 Body mass index (BMI) 35.0-35.9, adult: Secondary | ICD-10-CM | POA: Diagnosis not present

## 2017-07-11 DIAGNOSIS — I10 Essential (primary) hypertension: Secondary | ICD-10-CM | POA: Diagnosis not present

## 2017-07-11 DIAGNOSIS — E669 Obesity, unspecified: Secondary | ICD-10-CM | POA: Diagnosis not present

## 2017-07-11 DIAGNOSIS — Z794 Long term (current) use of insulin: Secondary | ICD-10-CM | POA: Insufficient documentation

## 2017-07-11 DIAGNOSIS — E119 Type 2 diabetes mellitus without complications: Secondary | ICD-10-CM | POA: Insufficient documentation

## 2017-07-11 DIAGNOSIS — M65332 Trigger finger, left middle finger: Secondary | ICD-10-CM | POA: Insufficient documentation

## 2017-07-11 HISTORY — PX: TRIGGER FINGER RELEASE: SHX641

## 2017-07-11 HISTORY — DX: Trigger finger, unspecified finger: M65.30

## 2017-07-11 LAB — GLUCOSE, CAPILLARY
GLUCOSE-CAPILLARY: 186 mg/dL — AB (ref 65–99)
GLUCOSE-CAPILLARY: 168 mg/dL — AB (ref 65–99)

## 2017-07-11 SURGERY — RELEASE, A1 PULLEY, FOR TRIGGER FINGER
Anesthesia: Monitor Anesthesia Care | Site: Finger | Laterality: Left

## 2017-07-11 MED ORDER — BUPIVACAINE HCL (PF) 0.25 % IJ SOLN
INTRAMUSCULAR | Status: AC
  Filled 2017-07-11: qty 120

## 2017-07-11 MED ORDER — FENTANYL CITRATE (PF) 100 MCG/2ML IJ SOLN: 25.0000 ug | INTRAMUSCULAR | Status: DC | PRN

## 2017-07-11 MED ORDER — SCOPOLAMINE 1 MG/3DAYS TD PT72: 1.0000 | MEDICATED_PATCH | Freq: Once | TRANSDERMAL | Status: DC | PRN

## 2017-07-11 MED ORDER — ONDANSETRON HCL 4 MG PO TABS: 4.0000 mg | ORAL_TABLET | Freq: Three times a day (TID) | ORAL | 0 refills | Status: AC | PRN

## 2017-07-11 MED ORDER — PROPOFOL 500 MG/50ML IV EMUL
INTRAVENOUS | Status: AC
  Filled 2017-07-11: qty 50

## 2017-07-11 MED ORDER — MIDAZOLAM HCL 5 MG/5ML IJ SOLN
INTRAMUSCULAR | Status: DC | PRN
  Administered 2017-07-11: 2 mg via INTRAVENOUS

## 2017-07-11 MED ORDER — HYDROCODONE-ACETAMINOPHEN 5-325 MG PO TABS: 1.0000 | ORAL_TABLET | ORAL | 0 refills | Status: DC | PRN

## 2017-07-11 MED ORDER — MEPERIDINE HCL 25 MG/ML IJ SOLN: 6.2500 mg | INTRAMUSCULAR | Status: DC | PRN

## 2017-07-11 MED ORDER — BUPIVACAINE HCL (PF) 0.5 % IJ SOLN
INTRAMUSCULAR | Status: AC
  Filled 2017-07-11: qty 30

## 2017-07-11 MED ORDER — PROPOFOL 500 MG/50ML IV EMUL
INTRAVENOUS | Status: DC | PRN
  Administered 2017-07-11: 25 ug/kg/min via INTRAVENOUS

## 2017-07-11 MED ORDER — BUPIVACAINE HCL (PF) 0.5 % IJ SOLN
INTRAMUSCULAR | Status: DC | PRN
  Administered 2017-07-11: 5 mL

## 2017-07-11 MED ORDER — CEFAZOLIN SODIUM-DEXTROSE 2-4 GM/100ML-% IV SOLN
INTRAVENOUS | Status: AC
  Filled 2017-07-11: qty 100

## 2017-07-11 MED ORDER — FENTANYL CITRATE (PF) 100 MCG/2ML IJ SOLN
INTRAMUSCULAR | Status: DC | PRN
  Administered 2017-07-11: 50 ug via INTRAVENOUS

## 2017-07-11 MED ORDER — CHLORHEXIDINE GLUCONATE 4 % EX LIQD: 60.0000 mL | Freq: Once | CUTANEOUS | Status: DC

## 2017-07-11 MED ORDER — CEFAZOLIN SODIUM-DEXTROSE 2-4 GM/100ML-% IV SOLN
2.0000 g | INTRAVENOUS | Status: AC
  Administered 2017-07-11: 2 g via INTRAVENOUS

## 2017-07-11 MED ORDER — LIDOCAINE 2% (20 MG/ML) 5 ML SYRINGE
INTRAMUSCULAR | Status: AC
  Filled 2017-07-11: qty 5

## 2017-07-11 MED ORDER — ONDANSETRON HCL 4 MG/2ML IJ SOLN
INTRAMUSCULAR | Status: DC | PRN
  Administered 2017-07-11: 4 mg via INTRAVENOUS

## 2017-07-11 MED ORDER — MIDAZOLAM HCL 2 MG/2ML IJ SOLN: 1.0000 mg | INTRAMUSCULAR | Status: DC | PRN

## 2017-07-11 MED ORDER — LACTATED RINGERS IV SOLN
INTRAVENOUS | Status: DC
  Administered 2017-07-11: 07:00:00 via INTRAVENOUS

## 2017-07-11 MED ORDER — ONDANSETRON HCL 4 MG/2ML IJ SOLN: 4.0000 mg | Freq: Once | INTRAMUSCULAR | Status: DC | PRN

## 2017-07-11 MED ORDER — ONDANSETRON HCL 4 MG/2ML IJ SOLN
INTRAMUSCULAR | Status: AC
  Filled 2017-07-11: qty 2

## 2017-07-11 MED ORDER — PROPOFOL 10 MG/ML IV BOLUS
INTRAVENOUS | Status: AC
  Filled 2017-07-11: qty 20

## 2017-07-11 MED ORDER — FENTANYL CITRATE (PF) 100 MCG/2ML IJ SOLN
INTRAMUSCULAR | Status: AC
  Filled 2017-07-11: qty 2

## 2017-07-11 MED ORDER — MIDAZOLAM HCL 2 MG/2ML IJ SOLN
INTRAMUSCULAR | Status: AC
  Filled 2017-07-11: qty 2

## 2017-07-11 MED ORDER — LIDOCAINE HCL (PF) 0.5 % IJ SOLN
INTRAMUSCULAR | Status: DC | PRN
  Administered 2017-07-11: 30 mL via INTRAVENOUS

## 2017-07-11 MED ORDER — FENTANYL CITRATE (PF) 100 MCG/2ML IJ SOLN: 50.0000 ug | INTRAMUSCULAR | Status: DC | PRN

## 2017-07-11 SURGICAL SUPPLY — 43 items
BANDAGE ACE 3X5.8 VEL STRL LF (GAUZE/BANDAGES/DRESSINGS) ×2 IMPLANT
BLADE SURG 15 STRL LF DISP TIS (BLADE) ×1 IMPLANT
BLADE SURG 15 STRL SS (BLADE) ×2
BNDG CMPR 9X4 STRL LF SNTH (GAUZE/BANDAGES/DRESSINGS)
BNDG COHESIVE 3X5 TAN STRL LF (GAUZE/BANDAGES/DRESSINGS) ×2 IMPLANT
BNDG ESMARK 4X9 LF (GAUZE/BANDAGES/DRESSINGS) IMPLANT
CORD BIPOLAR FORCEPS 12FT (ELECTRODE) IMPLANT
COVER BACK TABLE 60X90IN (DRAPES) ×2 IMPLANT
COVER MAYO STAND STRL (DRAPES) ×2 IMPLANT
CUFF TOURNIQUET SINGLE 18IN (TOURNIQUET CUFF) ×1 IMPLANT
DRAPE EXTREMITY T 121X128X90 (DRAPE) ×2 IMPLANT
DRAPE SURG 17X23 STRL (DRAPES) ×2 IMPLANT
DRSG PAD ABDOMINAL 8X10 ST (GAUZE/BANDAGES/DRESSINGS) ×2 IMPLANT
DURAPREP 26ML APPLICATOR (WOUND CARE) ×2 IMPLANT
GAUZE SPONGE 4X4 12PLY STRL (GAUZE/BANDAGES/DRESSINGS) ×2 IMPLANT
GAUZE XEROFORM 1X8 LF (GAUZE/BANDAGES/DRESSINGS) ×2 IMPLANT
GLOVE BIOGEL PI IND STRL 7.0 (GLOVE) ×1 IMPLANT
GLOVE BIOGEL PI INDICATOR 7.0 (GLOVE) ×1
GLOVE ECLIPSE 7.0 STRL STRAW (GLOVE) ×2 IMPLANT
GLOVE SURG ORTHO 8.0 STRL STRW (GLOVE) ×2 IMPLANT
GOWN STRL REUS W/ TWL LRG LVL3 (GOWN DISPOSABLE) ×1 IMPLANT
GOWN STRL REUS W/ TWL XL LVL3 (GOWN DISPOSABLE) ×2 IMPLANT
GOWN STRL REUS W/TWL LRG LVL3 (GOWN DISPOSABLE) ×2
GOWN STRL REUS W/TWL XL LVL3 (GOWN DISPOSABLE) ×4
NDL HYPO 25X1 1.5 SAFETY (NEEDLE) ×1 IMPLANT
NEEDLE HYPO 25X1 1.5 SAFETY (NEEDLE) ×2 IMPLANT
NS IRRIG 1000ML POUR BTL (IV SOLUTION) ×2 IMPLANT
PACK BASIN DAY SURGERY FS (CUSTOM PROCEDURE TRAY) ×2 IMPLANT
PAD CAST 3X4 CTTN HI CHSV (CAST SUPPLIES) ×2 IMPLANT
PADDING CAST ABS 3INX4YD NS (CAST SUPPLIES) ×1
PADDING CAST ABS 4INX4YD NS (CAST SUPPLIES) ×1
PADDING CAST ABS COTTON 3X4 (CAST SUPPLIES) ×1 IMPLANT
PADDING CAST ABS COTTON 4X4 ST (CAST SUPPLIES) ×1 IMPLANT
PADDING CAST COTTON 3X4 STRL (CAST SUPPLIES) ×4
SPLINT FIBERGLASS 3X35 (CAST SUPPLIES) ×2 IMPLANT
SPLINT PLASTER CAST XFAST 3X15 (CAST SUPPLIES) IMPLANT
SPLINT PLASTER XTRA FASTSET 3X (CAST SUPPLIES)
STOCKINETTE 4X48 STRL (DRAPES) ×2 IMPLANT
SUT ETHILON 3 0 PS 1 (SUTURE) ×2 IMPLANT
SYR BULB 3OZ (MISCELLANEOUS) ×2 IMPLANT
SYR CONTROL 10ML LL (SYRINGE) ×2 IMPLANT
TOWEL OR 17X24 6PK STRL BLUE (TOWEL DISPOSABLE) ×2 IMPLANT
UNDERPAD 30X30 (UNDERPADS AND DIAPERS) ×2 IMPLANT

## 2017-07-11 NOTE — Op Note (Signed)
NAME:  [name], [name] NO.:  MEDICAL RECORD NO.:  [id number]  LOCATION:                                 FACILITY:  PHYSICIAN:  Ninetta Lights, M.D.      DATE OF BIRTH:  DATE OF PROCEDURE:  07/11/2017 DATE OF DISCHARGE:                              OPERATIVE REPORT   PREOPERATIVE DIAGNOSIS:  Triggering A1 pulley, left long finger.  POSTOPERATIVE DIAGNOSIS:  Triggering A1 pulley, left long finger.  PROCEDURE:  Release A1 pulley, left long finger.  SURGEON:  Ninetta Lights, M.D.  ASSISTANT:  Tawanna Cooler, PA.  ANESTHESIA:  IV regional.  SPECIMENS:  None.  CULTURES:  None.  COMPLICATION:  None.  DRESSINGS:  Sterile compressive.  DESCRIPTION OF PROCEDURE:  The patient was brought to the operating room and after adequate anesthesia, prepped and draped in usual sterile fashion.  A small transverse incision over the A1 pulley, middle finger. Neurovascular bundles identified and retracted.  I then did a complete release of the A1 pulley, which was markedly thickened and there was fluid beneath it.  Once that was complete, all triggering obliterated. Wound irrigated and closed with nylon.  Sterile compressive dressing applied.  Anesthesia reversed.  Brought to the recovery room.  Tolerated the surgery well.  No complications.     Ninetta Lights, M.D.     DFM/MEDQ  D:  07/11/2017  T:  07/11/2017  Job:  [id number]

## 2017-07-11 NOTE — Anesthesia Postprocedure Evaluation (Signed)
Anesthesia Post Note  Patient: [name]  Procedure(s) Performed: RELEASE TRIGGER FINGER/A-1 PULLEY LEFT MIDDLE (Left Finger)     Patient location during evaluation: PACU Anesthesia Type: MAC Level of consciousness: awake and alert Pain management: pain level controlled Vital Signs Assessment: post-procedure vital signs reviewed and stable Respiratory status: spontaneous breathing, nonlabored ventilation, respiratory function stable and patient connected to nasal cannula oxygen Cardiovascular status: stable and blood pressure returned to baseline Postop Assessment: no apparent nausea or vomiting Anesthetic complications: no    Last Vitals:  Vitals:   07/11/17 0845 07/11/17 0857  BP: 114/84 124/77  Pulse: 69   Resp: 13   Temp:    SpO2: 98%     Last Pain:  Vitals:   07/11/17 0857  TempSrc:   PainSc: 0-No pain                 Delonda Coley

## 2017-07-11 NOTE — Transfer of Care (Signed)
Immediate Anesthesia Transfer of Care Note  Patient: [name]  Procedure(s) Performed: RELEASE TRIGGER FINGER/A-1 PULLEY LEFT MIDDLE (Left Finger)  Patient Location: PACU  Anesthesia Type:MAC and Bier block  Level of Consciousness: awake and patient cooperative  Airway & Oxygen Therapy: Patient Spontanous Breathing and Patient connected to face mask oxygen  Post-op Assessment: Report given to RN and Post -op Vital signs reviewed and stable  Post vital signs: Reviewed and stable  Last Vitals:  Vitals:   07/11/17 0642  BP: 108/71  Pulse: 82  Resp: 18  Temp: 36.8 C  SpO2: 99%    Last Pain:  Vitals:   07/11/17 0642  TempSrc: Oral  PainSc: 0-No pain         Complications: No apparent anesthesia complications

## 2017-07-11 NOTE — Interval H&P Note (Signed)
History and Physical Interval Note:  07/11/2017 7:46 AM  [name]  has presented today for surgery, with the diagnosis of trigger finger, left middle finger  M65.332  The various methods of treatment have been discussed with the patient and family. After consideration of risks, benefits and other options for treatment, the patient has consented to  Procedure(s): RELEASE TRIGGER FINGER/A-1 PULLEY LEFT MIDDLE (Left) as a surgical intervention .  The patient's history has been reviewed, patient examined, no change in status, stable for surgery.  I have reviewed the patient's chart and labs.  Questions were answered to the patient's satisfaction.     Ninetta Lights

## 2017-07-11 NOTE — Discharge Instructions (Signed)
Do not remove bandage.  Do not get bandage wet.  Apply ice and elevate for pain and swelling.   Call your surgeon if you experience:   1.  Fever over 101.0. 2.  Inability to urinate. 3.  Nausea and/or vomiting. 4.  Extreme swelling or bruising at the surgical site. 5.  Continued bleeding from the incision. 6.  Increased pain, redness or drainage from the incision. 7.  Problems related to your pain medication. 8.  Any problems and/or concernsCall your surgeon if you experience:     Post Anesthesia Home Care Instructions  Activity: Get plenty of rest for the remainder of the day. A responsible individual must stay with you for 24 hours following the procedure.  For the next 24 hours, DO NOT: -Drive a car -Paediatric nurse -Drink alcoholic beverages -Take any medication unless instructed by your physician -Make any legal decisions or sign important papers.  Meals: Start with liquid foods such as gelatin or soup. Progress to regular foods as tolerated. Avoid greasy, spicy, heavy foods. If nausea and/or vomiting occur, drink only clear liquids until the nausea and/or vomiting subsides. Call your physician if vomiting continues.  Special Instructions/Symptoms: Your throat may feel dry or sore from the anesthesia or the breathing tube placed in your throat during surgery. If this causes discomfort, gargle with warm salt water. The discomfort should disappear within 24 hours.  If you had a scopolamine patch placed behind your ear for the management of post- operative nausea and/or vomiting:  1. The medication in the patch is effective for 72 hours, after which it should be removed.  Wrap patch in a tissue and discard in the trash. Wash hands thoroughly with soap and water. 2. You may remove the patch earlier than 72 hours if you experience unpleasant side effects which may include dry mouth, dizziness or visual disturbances. 3. Avoid touching the patch. Wash your hands with soap and  water after contact with the patch.

## 2017-07-11 NOTE — Anesthesia Procedure Notes (Signed)
Procedure Name: MAC Date/Time: 07/11/2017 7:46 AM Performed by: Marrianne Mood Pre-anesthesia Checklist: Patient identified, Timeout performed, Emergency Drugs available, Suction available and Patient being monitored Patient Re-evaluated:Patient Re-evaluated prior to induction Oxygen Delivery Method: Circle system utilized and Simple face mask Preoxygenation: Pre-oxygenation with 100% oxygen

## 2017-07-11 NOTE — Anesthesia Procedure Notes (Signed)
Anesthesia Regional Block: Bier block (IV Regional)   Pre-Anesthetic Checklist: ,, timeout performed, Correct Patient, Correct Site, Correct Laterality, Correct Procedure, Correct Position, site marked, Risks and benefits discussed,  Surgical consent,  Pre-op evaluation,  At surgeon's request and post-op pain management  Laterality: Left  Prep: chloraprep        Narrative:  Start time: 07/11/2017 7:53 AM End time: 07/11/2017 7:55 AM

## 2017-07-12 ENCOUNTER — Encounter (HOSPITAL_BASED_OUTPATIENT_CLINIC_OR_DEPARTMENT_OTHER): Payer: Self-pay | Admitting: Orthopedic Surgery

## 2017-07-19 ENCOUNTER — Encounter (HOSPITAL_BASED_OUTPATIENT_CLINIC_OR_DEPARTMENT_OTHER): Payer: Self-pay | Admitting: *Deleted

## 2017-07-22 NOTE — H&P (Signed)
[name] comes in for follow up.  Most troubling are worsening bilateral A1 triggering long finger, both hands.  Her carpal tunnel syndrome recurrent symptoms are there, but tolerable after previous release.  Her degree of tenosynovitis has settled down.  We have injected trigger fingers with only transient improvement.  She comes in to discuss definitive treatment with release.   History and general exam are reviewed.   EXAMINATION: Specifically, marked triggering A1 pulley, long finger both hands.  De Quervain's tenosynovitis is better.  She is neurovascularly intact.  Healed carpal tunnel incisions.  No atrophy.  No weakness.    DISPOSITION:  We are going to proceed with A1 pulley releases on both hands sequentially, long finger.  That procedure, risks, benefits and complications reviewed.  Paperwork complete.  All questions answered.  I have also given her a note to have a little more breaks at work where she does repetitive work to try to help with her symptoms in the interim.  I went over this with her and she understands.

## 2017-07-25 ENCOUNTER — Ambulatory Visit (HOSPITAL_BASED_OUTPATIENT_CLINIC_OR_DEPARTMENT_OTHER): Payer: 59 | Admitting: Anesthesiology

## 2017-07-25 ENCOUNTER — Ambulatory Visit (HOSPITAL_BASED_OUTPATIENT_CLINIC_OR_DEPARTMENT_OTHER)
Admission: RE | Admit: 2017-07-25 | Discharge: 2017-07-25 | Disposition: A | Discharge status: Home / Self Care | Payer: 59 | Source: Ambulatory Visit | Attending: Orthopedic Surgery | Admitting: Orthopedic Surgery

## 2017-07-25 ENCOUNTER — Encounter (HOSPITAL_BASED_OUTPATIENT_CLINIC_OR_DEPARTMENT_OTHER): Payer: Self-pay | Admitting: *Deleted

## 2017-07-25 ENCOUNTER — Encounter (HOSPITAL_BASED_OUTPATIENT_CLINIC_OR_DEPARTMENT_OTHER)
Admission: RE | Disposition: A | Discharge status: Home / Self Care | Payer: Self-pay | Source: Ambulatory Visit | Attending: Orthopedic Surgery

## 2017-07-25 ENCOUNTER — Other Ambulatory Visit: Payer: Self-pay

## 2017-07-25 DIAGNOSIS — E119 Type 2 diabetes mellitus without complications: Secondary | ICD-10-CM | POA: Diagnosis not present

## 2017-07-25 DIAGNOSIS — M65331 Trigger finger, right middle finger: Secondary | ICD-10-CM | POA: Insufficient documentation

## 2017-07-25 DIAGNOSIS — Z794 Long term (current) use of insulin: Secondary | ICD-10-CM | POA: Diagnosis not present

## 2017-07-25 DIAGNOSIS — M654 Radial styloid tenosynovitis [de Quervain]: Secondary | ICD-10-CM | POA: Diagnosis not present

## 2017-07-25 DIAGNOSIS — Z6834 Body mass index (BMI) 34.0-34.9, adult: Secondary | ICD-10-CM | POA: Diagnosis not present

## 2017-07-25 DIAGNOSIS — Z87891 Personal history of nicotine dependence: Secondary | ICD-10-CM | POA: Diagnosis not present

## 2017-07-25 DIAGNOSIS — G5603 Carpal tunnel syndrome, bilateral upper limbs: Secondary | ICD-10-CM | POA: Insufficient documentation

## 2017-07-25 HISTORY — DX: Trigger finger, unspecified finger: M65.30

## 2017-07-25 HISTORY — PX: TRIGGER FINGER RELEASE: SHX641

## 2017-07-25 LAB — GLUCOSE, CAPILLARY
GLUCOSE-CAPILLARY: 159 mg/dL — AB (ref 65–99)
GLUCOSE-CAPILLARY: 132 mg/dL — AB (ref 65–99)

## 2017-07-25 SURGERY — RELEASE, A1 PULLEY, FOR TRIGGER FINGER
Anesthesia: Monitor Anesthesia Care | Site: Hand | Laterality: Right

## 2017-07-25 MED ORDER — ONDANSETRON HCL 4 MG/2ML IJ SOLN
INTRAMUSCULAR | Status: DC | PRN
  Administered 2017-07-25: 4 mg via INTRAVENOUS

## 2017-07-25 MED ORDER — OXYCODONE-ACETAMINOPHEN 5-325 MG PO TABS: 1.0000 | ORAL_TABLET | ORAL | 0 refills | Status: AC | PRN

## 2017-07-25 MED ORDER — MIDAZOLAM HCL 2 MG/2ML IJ SOLN
INTRAMUSCULAR | Status: AC
  Filled 2017-07-25: qty 2

## 2017-07-25 MED ORDER — ACETAMINOPHEN 160 MG/5ML PO SOLN: 325.0000 mg | ORAL | Status: DC | PRN

## 2017-07-25 MED ORDER — SCOPOLAMINE 1 MG/3DAYS TD PT72: 1.0000 | MEDICATED_PATCH | Freq: Once | TRANSDERMAL | Status: DC | PRN

## 2017-07-25 MED ORDER — ONDANSETRON HCL 4 MG/2ML IJ SOLN
INTRAMUSCULAR | Status: AC
  Filled 2017-07-25: qty 2

## 2017-07-25 MED ORDER — BUPIVACAINE HCL (PF) 0.5 % IJ SOLN
INTRAMUSCULAR | Status: DC | PRN
  Administered 2017-07-25: 5 mL

## 2017-07-25 MED ORDER — FENTANYL CITRATE (PF) 100 MCG/2ML IJ SOLN
50.0000 ug | INTRAMUSCULAR | Status: DC | PRN
  Administered 2017-07-25: 25 ug via INTRAVENOUS
  Administered 2017-07-25: 50 ug via INTRAVENOUS

## 2017-07-25 MED ORDER — OXYCODONE HCL 5 MG/5ML PO SOLN: 5.0000 mg | Freq: Once | ORAL | Status: DC | PRN

## 2017-07-25 MED ORDER — LACTATED RINGERS IV SOLN
INTRAVENOUS | Status: DC
  Administered 2017-07-25: 08:00:00 via INTRAVENOUS

## 2017-07-25 MED ORDER — CHLORHEXIDINE GLUCONATE 4 % EX LIQD: 60.0000 mL | Freq: Once | CUTANEOUS | Status: DC

## 2017-07-25 MED ORDER — FENTANYL CITRATE (PF) 100 MCG/2ML IJ SOLN: 25.0000 ug | INTRAMUSCULAR | Status: DC | PRN

## 2017-07-25 MED ORDER — FENTANYL CITRATE (PF) 100 MCG/2ML IJ SOLN
INTRAMUSCULAR | Status: AC
  Filled 2017-07-25: qty 2

## 2017-07-25 MED ORDER — CEFAZOLIN SODIUM-DEXTROSE 2-4 GM/100ML-% IV SOLN
2.0000 g | INTRAVENOUS | Status: AC
  Administered 2017-07-25: 2 g via INTRAVENOUS

## 2017-07-25 MED ORDER — LIDOCAINE HCL (PF) 0.5 % IJ SOLN
INTRAMUSCULAR | Status: DC | PRN
  Administered 2017-07-25: 35 mL via INTRAVENOUS

## 2017-07-25 MED ORDER — MIDAZOLAM HCL 2 MG/2ML IJ SOLN
1.0000 mg | INTRAMUSCULAR | Status: DC | PRN
  Administered 2017-07-25: 0.5 mg via INTRAVENOUS
  Administered 2017-07-25: 1 mg via INTRAVENOUS

## 2017-07-25 MED ORDER — CEFAZOLIN SODIUM-DEXTROSE 2-4 GM/100ML-% IV SOLN
INTRAVENOUS | Status: AC
  Filled 2017-07-25: qty 100

## 2017-07-25 MED ORDER — LACTATED RINGERS IV SOLN
INTRAVENOUS | Status: DC
  Administered 2017-07-25: 09:00:00 via INTRAVENOUS

## 2017-07-25 MED ORDER — PROPOFOL 500 MG/50ML IV EMUL
INTRAVENOUS | Status: DC | PRN
  Administered 2017-07-25: 75 ug/kg/min via INTRAVENOUS

## 2017-07-25 MED ORDER — OXYCODONE HCL 5 MG PO TABS: 5.0000 mg | ORAL_TABLET | Freq: Once | ORAL | Status: DC | PRN

## 2017-07-25 MED ORDER — ACETAMINOPHEN 325 MG PO TABS: 325.0000 mg | ORAL_TABLET | ORAL | Status: DC | PRN

## 2017-07-25 SURGICAL SUPPLY — 44 items
BANDAGE ACE 3X5.8 VEL STRL LF (GAUZE/BANDAGES/DRESSINGS) ×2 IMPLANT
BLADE SURG 15 STRL LF DISP TIS (BLADE) ×1 IMPLANT
BLADE SURG 15 STRL SS (BLADE) ×2
BNDG CMPR 9X4 STRL LF SNTH (GAUZE/BANDAGES/DRESSINGS) ×1
BNDG COHESIVE 3X5 TAN STRL LF (GAUZE/BANDAGES/DRESSINGS) ×2 IMPLANT
BNDG ESMARK 4X9 LF (GAUZE/BANDAGES/DRESSINGS) ×1 IMPLANT
CORD BIPOLAR FORCEPS 12FT (ELECTRODE) IMPLANT
COVER BACK TABLE 60X90IN (DRAPES) ×2 IMPLANT
COVER MAYO STAND STRL (DRAPES) ×2 IMPLANT
CUFF TOURNIQUET SINGLE 18IN (TOURNIQUET CUFF) ×1 IMPLANT
DRAPE EXTREMITY T 121X128X90 (DRAPE) ×2 IMPLANT
DRAPE SURG 17X23 STRL (DRAPES) ×2 IMPLANT
DRSG PAD ABDOMINAL 8X10 ST (GAUZE/BANDAGES/DRESSINGS) ×2 IMPLANT
DURAPREP 26ML APPLICATOR (WOUND CARE) ×2 IMPLANT
GAUZE SPONGE 4X4 12PLY STRL (GAUZE/BANDAGES/DRESSINGS) ×2 IMPLANT
GAUZE XEROFORM 1X8 LF (GAUZE/BANDAGES/DRESSINGS) ×2 IMPLANT
GLOVE BIOGEL PI IND STRL 7.0 (GLOVE) ×1 IMPLANT
GLOVE BIOGEL PI INDICATOR 7.0 (GLOVE) ×3
GLOVE ECLIPSE 6.5 STRL STRAW (GLOVE) ×1 IMPLANT
GLOVE ECLIPSE 7.0 STRL STRAW (GLOVE) ×2 IMPLANT
GLOVE SURG ORTHO 8.0 STRL STRW (GLOVE) ×2 IMPLANT
GOWN STRL REUS W/ TWL LRG LVL3 (GOWN DISPOSABLE) ×1 IMPLANT
GOWN STRL REUS W/ TWL XL LVL3 (GOWN DISPOSABLE) ×2 IMPLANT
GOWN STRL REUS W/TWL LRG LVL3 (GOWN DISPOSABLE) ×2
GOWN STRL REUS W/TWL XL LVL3 (GOWN DISPOSABLE) ×4
NDL HYPO 25X1 1.5 SAFETY (NEEDLE) ×1 IMPLANT
NEEDLE HYPO 25X1 1.5 SAFETY (NEEDLE) ×2 IMPLANT
NS IRRIG 1000ML POUR BTL (IV SOLUTION) ×2 IMPLANT
PACK BASIN DAY SURGERY FS (CUSTOM PROCEDURE TRAY) ×2 IMPLANT
PAD CAST 3X4 CTTN HI CHSV (CAST SUPPLIES) ×2 IMPLANT
PADDING CAST ABS 3INX4YD NS (CAST SUPPLIES)
PADDING CAST ABS 4INX4YD NS (CAST SUPPLIES) ×1
PADDING CAST ABS COTTON 3X4 (CAST SUPPLIES) ×1 IMPLANT
PADDING CAST ABS COTTON 4X4 ST (CAST SUPPLIES) ×1 IMPLANT
PADDING CAST COTTON 3X4 STRL (CAST SUPPLIES) ×2
SPLINT FIBERGLASS 3X35 (CAST SUPPLIES) ×1 IMPLANT
SPLINT PLASTER CAST XFAST 3X15 (CAST SUPPLIES) IMPLANT
SPLINT PLASTER XTRA FASTSET 3X (CAST SUPPLIES)
STOCKINETTE 4X48 STRL (DRAPES) ×2 IMPLANT
SUT ETHILON 3 0 PS 1 (SUTURE) ×2 IMPLANT
SYR BULB 3OZ (MISCELLANEOUS) ×2 IMPLANT
SYR CONTROL 10ML LL (SYRINGE) ×2 IMPLANT
TOWEL OR 17X24 6PK STRL BLUE (TOWEL DISPOSABLE) ×2 IMPLANT
UNDERPAD 30X30 (UNDERPADS AND DIAPERS) ×1 IMPLANT

## 2017-07-25 NOTE — Anesthesia Postprocedure Evaluation (Signed)
Anesthesia Post Note  Patient: [name]  Procedure(s) Performed: RELEASE TRIGGER FINGER, RIGHT MIDDLE FINGER (Right Hand)     Patient location during evaluation: PACU Anesthesia Type: MAC Level of consciousness: awake and alert Pain management: pain level controlled Vital Signs Assessment: post-procedure vital signs reviewed and stable Respiratory status: spontaneous breathing, nonlabored ventilation, respiratory function stable and patient connected to nasal cannula oxygen Cardiovascular status: stable and blood pressure returned to baseline Postop Assessment: no apparent nausea or vomiting Anesthetic complications: no    Last Vitals:  Vitals:   07/25/17 0955 07/25/17 1000  BP: 120/90 131/85  Pulse: 71 70  Resp: 16 16  Temp:  36.8 C  SpO2: 100% 99%    Last Pain:  Vitals:   07/25/17 1000  TempSrc:   PainSc: 0-No pain                 Catalina Gravel

## 2017-07-25 NOTE — Interval H&P Note (Signed)
History and Physical Interval Note:  07/25/2017 8:50 AM  [name]  has presented today for surgery, with the diagnosis of TRIGGER FINGER, RIGHT MIDDLE FINGER M65.331  The various methods of treatment have been discussed with the patient and family. After consideration of risks, benefits and other options for treatment, the patient has consented to  Procedure(s): RELEASE TRIGGER FINGER, RIGHT MIDDLE FINGER (Right) as a surgical intervention .  The patient's history has been reviewed, patient examined, no change in status, stable for surgery.  I have reviewed the patient's chart and labs.  Questions were answered to the patient's satisfaction.     Ninetta Lights

## 2017-07-25 NOTE — Anesthesia Preprocedure Evaluation (Signed)
Anesthesia Evaluation  Patient identified by MRN, date of birth, ID band Patient awake    Reviewed: Allergy & Precautions, H&P , NPO status , Patient's Chart, lab work & pertinent test results  History of Anesthesia Complications Negative for: history of anesthetic complications  Airway Mallampati: II  TM Distance: >3 FB Neck ROM: full    Dental  (+) Teeth Intact   Pulmonary neg shortness of breath, neg sleep apnea, neg COPD, neg recent URI, Current Smoker, former smoker,    breath sounds clear to auscultation       Cardiovascular hypertension, (-) angina(-) CHF (-) dysrhythmias  Rhythm:Regular Rate:Normal     Neuro/Psych negative neurological ROS  negative psych ROS   GI/Hepatic negative GI ROS, Neg liver ROS,   Endo/Other  diabetes, Well Controlled, Gestational, Insulin DependentMorbid obesityObesity  Renal/GU negative Renal ROS     Musculoskeletal   Abdominal (+) + obese,   Peds  Hematology negative hematology ROS (+)   Anesthesia Other Findings   Reproductive/Obstetrics Desires Sterilization- for PPTL                             Anesthesia Physical Anesthesia Plan  ASA: II  Anesthesia Plan: MAC and Bier Block and Bier Block-LIDOCAINE ONLY   Post-op Pain Management:    Induction:   PONV Risk Score and Plan: 1 and Ondansetron  Airway Management Planned: Nasal Cannula  Additional Equipment: None  Intra-op Plan:   Post-operative Plan:   Informed Consent: I have reviewed the patients History and Physical, chart, labs and discussed the procedure including the risks, benefits and alternatives for the proposed anesthesia with the patient or authorized representative who has indicated his/her understanding and acceptance.   Dental advisory given  Plan Discussed with: CRNA and Surgeon  Anesthesia Plan Comments:         Anesthesia Quick Evaluation

## 2017-07-25 NOTE — Op Note (Signed)
NAME:  [name], [name] NO.:  MEDICAL RECORD NO.:  [id number]  LOCATION:                                 FACILITY:  PHYSICIAN:  Ninetta Lights, M.D.      DATE OF BIRTH:  DATE OF PROCEDURE:  07/25/2017 DATE OF DISCHARGE:                              OPERATIVE REPORT   PREOPERATIVE DIAGNOSIS:  Triggering A1 pulley, right middle finger.  POSTOPERATIVE DIAGNOSIS:  Triggering A1 pulley, right middle finger.  PROCEDURE:  Release A1 pulley, right middle finger.  SURGEON:  Ninetta Lights, M.D.  ASSISTANT:  Tawanna Cooler, PA.  ANESTHESIA:  IV regional.  SPECIMENS:  None.  CULTURES:  None.  COMPLICATIONS:  None.  DRESSINGS:  Sterile compressive.  DESCRIPTION OF PROCEDURE:  The patient was brought to the operating room and after adequate anesthesia had been obtained, prepped and draped in usual sterile fashion.  Small transverse incision over the A1 pulley, long finger.  Skin and subcutaneous tissues were divided.  Neurovascular bundles were identified, protected, and retracted.  The A1 pulley released in its entirety under direct visualization, obliterating triggering.  Nodular density in the tendon was appreciated, but was, otherwise intact.  Wound irrigated.  Closed with nylon.  Margins were injected with Marcaine.  Sterile compressive dressing applied.  We then went ahead and removed the sutures from her left hand, where I did a A1 pulley release middle finger there.  Anesthesia reversed.  Brought to recovery room.  Tolerated the surgery well.  No complications.     Ninetta Lights, M.D.    DFM/MEDQ  D:  07/25/2017  T:  07/25/2017  Job:  [id number]

## 2017-07-25 NOTE — Discharge Instructions (Signed)
Do not remove bandage. Do not get bandage wet.  Ice and elevate for pain and swelling    Post Anesthesia Home Care Instructions  Activity: Get plenty of rest for the remainder of the day. A responsible individual must stay with you for 24 hours following the procedure.  For the next 24 hours, DO NOT: -Drive a car -Paediatric nurse -Drink alcoholic beverages -Take any medication unless instructed by your physician -Make any legal decisions or sign important papers.  Meals: Start with liquid foods such as gelatin or soup. Progress to regular foods as tolerated. Avoid greasy, spicy, heavy foods. If nausea and/or vomiting occur, drink only clear liquids until the nausea and/or vomiting subsides. Call your physician if vomiting continues.  Special Instructions/Symptoms: Your throat may feel dry or sore from the anesthesia or the breathing tube placed in your throat during surgery. If this causes discomfort, gargle with warm salt water. The discomfort should disappear within 24 hours.  If you had a scopolamine patch placed behind your ear for the management of post- operative nausea and/or vomiting:  1. The medication in the patch is effective for 72 hours, after which it should be removed.  Wrap patch in a tissue and discard in the trash. Wash hands thoroughly with soap and water. 2. You may remove the patch earlier than 72 hours if you experience unpleasant side effects which may include dry mouth, dizziness or visual disturbances. 3. Avoid touching the patch. Wash your hands with soap and water after contact with the patch.

## 2017-07-25 NOTE — Transfer of Care (Signed)
Immediate Anesthesia Transfer of Care Note  Patient: [name]  Procedure(s) Performed: RELEASE TRIGGER FINGER, RIGHT MIDDLE FINGER (Right Hand)  Patient Location: PACU  Anesthesia Type:Bier block  Level of Consciousness: awake, sedated and patient cooperative  Airway & Oxygen Therapy: Patient Spontanous Breathing  Post-op Assessment: Report given to RN and Post -op Vital signs reviewed and stable  Post vital signs: Reviewed and stable  Last Vitals:  Vitals:   07/25/17 0723 07/25/17 0931  BP: 118/72   Pulse: 78 88  Resp: 16 (!) 8  Temp: 36.7 C   SpO2: 100% 99%    Last Pain:  Vitals:   07/25/17 0723  TempSrc: Oral  PainSc: 0-No pain      Patients Stated Pain Goal: 3 (03/28/18 7588)  Complications: No apparent anesthesia complications

## 2017-07-26 ENCOUNTER — Encounter (HOSPITAL_BASED_OUTPATIENT_CLINIC_OR_DEPARTMENT_OTHER): Payer: Self-pay | Admitting: Orthopedic Surgery

## 2018-11-25 ENCOUNTER — Ambulatory Visit
Admission: RE | Admit: 2018-11-25 | Discharge: 2018-11-25 | Disposition: A | Discharge status: Home / Self Care | Payer: 59 | Source: Ambulatory Visit | Attending: Internal Medicine | Admitting: Internal Medicine

## 2018-11-25 ENCOUNTER — Other Ambulatory Visit: Payer: Self-pay | Admitting: Internal Medicine

## 2018-11-25 DIAGNOSIS — M533 Sacrococcygeal disorders, not elsewhere classified: Secondary | ICD-10-CM

## 2019-12-04 ENCOUNTER — Ambulatory Visit: Payer: 59 | Attending: Internal Medicine

## 2019-12-04 DIAGNOSIS — Z23 Encounter for immunization: Secondary | ICD-10-CM

## 2019-12-04 NOTE — Progress Notes (Signed)
   Covid-19 Vaccination Clinic  Name:  [name]    MRN: [id number] DOB: 18-Dec-1975  12/04/2019  [name] was observed post Covid-19 immunization for 30 minutes based on pre-vaccination screening without incident. She was provided with Vaccine Information Sheet and instruction to access the V-Safe system.   [name] was instructed to call 911 with any severe reactions post vaccine: Marland Kitchen Difficulty breathing  . Swelling of face and throat  . A fast heartbeat  . A bad rash all over body  . Dizziness and weakness   Immunizations Administered    Name Date Dose VIS Date Route   Pfizer COVID-19 Vaccine 12/04/2019  4:15 PM 0.3 mL 08/28/2019 Intramuscular   Manufacturer: [place]   Lot: [id number]   [place]: [id number]

## 2019-12-30 ENCOUNTER — Ambulatory Visit: Payer: Medicaid Other | Attending: Internal Medicine

## 2019-12-30 DIAGNOSIS — Z23 Encounter for immunization: Secondary | ICD-10-CM

## 2019-12-30 NOTE — Progress Notes (Signed)
   Covid-19 Vaccination Clinic  Name:  [name]    MRN: [id number] DOB: Dec 03, 1975  12/30/2019  [name] was observed post Covid-19 immunization for 15 minutes without incident. She was provided with Vaccine Information Sheet and instruction to access the V-Safe system.   [name] was instructed to call 911 with any severe reactions post vaccine: Marland Kitchen Difficulty breathing  . Swelling of face and throat  . A fast heartbeat  . A bad rash all over body  . Dizziness and weakness   Immunizations Administered    Name Date Dose VIS Date Route   Pfizer COVID-19 Vaccine 12/30/2019  4:08 PM 0.3 mL 08/28/2019 Intramuscular   Manufacturer: [place]   Lot: [id number]   [place]: [id number]

## 2021-08-13 ENCOUNTER — Emergency Department (HOSPITAL_BASED_OUTPATIENT_CLINIC_OR_DEPARTMENT_OTHER)
Admission: EM | Admit: 2021-08-13 | Discharge: 2021-08-13 | Disposition: A | Discharge status: Home / Self Care | Payer: No Typology Code available for payment source | Attending: Emergency Medicine | Admitting: Emergency Medicine

## 2021-08-13 ENCOUNTER — Emergency Department (HOSPITAL_BASED_OUTPATIENT_CLINIC_OR_DEPARTMENT_OTHER): Payer: No Typology Code available for payment source | Admitting: Radiology

## 2021-08-13 ENCOUNTER — Encounter (HOSPITAL_BASED_OUTPATIENT_CLINIC_OR_DEPARTMENT_OTHER): Payer: Self-pay

## 2021-08-13 ENCOUNTER — Other Ambulatory Visit: Payer: Self-pay

## 2021-08-13 DIAGNOSIS — M549 Dorsalgia, unspecified: Secondary | ICD-10-CM | POA: Insufficient documentation

## 2021-08-13 DIAGNOSIS — M542 Cervicalgia: Secondary | ICD-10-CM | POA: Diagnosis not present

## 2021-08-13 DIAGNOSIS — R519 Headache, unspecified: Secondary | ICD-10-CM | POA: Insufficient documentation

## 2021-08-13 DIAGNOSIS — F17210 Nicotine dependence, cigarettes, uncomplicated: Secondary | ICD-10-CM | POA: Insufficient documentation

## 2021-08-13 DIAGNOSIS — Z794 Long term (current) use of insulin: Secondary | ICD-10-CM | POA: Diagnosis not present

## 2021-08-13 DIAGNOSIS — Z7984 Long term (current) use of oral hypoglycemic drugs: Secondary | ICD-10-CM | POA: Diagnosis not present

## 2021-08-13 DIAGNOSIS — E119 Type 2 diabetes mellitus without complications: Secondary | ICD-10-CM | POA: Diagnosis not present

## 2021-08-13 MED ORDER — CYCLOBENZAPRINE HCL 10 MG PO TABS: 10.0000 mg | ORAL_TABLET | Freq: Two times a day (BID) | ORAL | 0 refills | Status: DC | PRN

## 2021-08-13 NOTE — Discharge Instructions (Signed)
Exam and imaging are reassuring.  You will be sore over the next couple days I recommend over-the-counter pain medication as needed, have also given you Flexeril which is a muscle laxer please use as needed.  Follow with PCP for further evaluation.  Come back to the emergency department if you develop chest pain, shortness of breath, severe abdominal pain, uncontrolled nausea, vomiting, diarrhea.
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
## 2021-08-13 NOTE — ED Notes (Signed)
Discharge instructions discussed with pt. Pt verbalized understanding. Pt stable and ambulatory. No signature pad available. 
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
## 2023-02-22 ENCOUNTER — Ambulatory Visit
Admission: RE | Admit: 2023-02-22 | Discharge: 2023-02-22 | Disposition: A | Discharge status: Home / Self Care | Payer: No Typology Code available for payment source | Source: Ambulatory Visit | Attending: Internal Medicine | Admitting: Internal Medicine

## 2023-02-22 ENCOUNTER — Other Ambulatory Visit: Payer: Self-pay | Admitting: Internal Medicine

## 2023-02-22 DIAGNOSIS — R109 Unspecified abdominal pain: Secondary | ICD-10-CM

## 2023-03-06 ENCOUNTER — Other Ambulatory Visit: Payer: Self-pay | Admitting: Internal Medicine

## 2023-03-06 DIAGNOSIS — R748 Abnormal levels of other serum enzymes: Secondary | ICD-10-CM

## 2023-03-06 DIAGNOSIS — R1013 Epigastric pain: Secondary | ICD-10-CM

## 2023-03-14 ENCOUNTER — Inpatient Hospital Stay: Admission: RE | Admit: 2023-03-14 | Payer: No Typology Code available for payment source | Source: Ambulatory Visit

## 2023-03-26 ENCOUNTER — Other Ambulatory Visit: Payer: Self-pay | Admitting: Internal Medicine

## 2023-03-26 DIAGNOSIS — R1013 Epigastric pain: Secondary | ICD-10-CM

## 2023-03-26 DIAGNOSIS — R748 Abnormal levels of other serum enzymes: Secondary | ICD-10-CM

## 2023-04-05 ENCOUNTER — Ambulatory Visit
Admission: RE | Admit: 2023-04-05 | Discharge: 2023-04-05 | Disposition: A | Discharge status: Home / Self Care | Payer: No Typology Code available for payment source | Source: Ambulatory Visit | Attending: Internal Medicine | Admitting: Internal Medicine

## 2023-04-05 DIAGNOSIS — R748 Abnormal levels of other serum enzymes: Secondary | ICD-10-CM

## 2023-04-05 DIAGNOSIS — R1013 Epigastric pain: Secondary | ICD-10-CM

## 2023-10-24 ENCOUNTER — Encounter: Payer: Self-pay | Admitting: Internal Medicine

## 2023-12-03 ENCOUNTER — Encounter: Payer: No Typology Code available for payment source | Admitting: Internal Medicine

## 2024-04-01 LAB — COLOGUARD: COLOGUARD: POSITIVE — AB

## 2024-04-17 ENCOUNTER — Encounter: Payer: Self-pay | Admitting: Internal Medicine

## 2024-04-22 ENCOUNTER — Other Ambulatory Visit: Payer: Self-pay

## 2024-04-22 ENCOUNTER — Ambulatory Visit (AMBULATORY_SURGERY_CENTER)

## 2024-04-22 VITALS — Ht 61.0 in | Wt 180.0 lb

## 2024-04-22 DIAGNOSIS — Z1211 Encounter for screening for malignant neoplasm of colon: Secondary | ICD-10-CM

## 2024-04-22 MED ORDER — NA SULFATE-K SULFATE-MG SULF 17.5-3.13-1.6 GM/177ML PO SOLN
1.0000 | Freq: Once | ORAL | 0 refills | Status: AC
Start: 2024-04-22 — End: 2024-04-22

## 2024-04-22 NOTE — Progress Notes (Signed)
 Denies allergies to eggs or soy products. Denies complication of anesthesia or sedation. Denies use of weight loss medication. Denies use of O2.   Emmi instructions given for colonoscopy.

## 2024-04-29 ENCOUNTER — Encounter: Payer: Self-pay | Admitting: Internal Medicine

## 2024-04-29 ENCOUNTER — Ambulatory Visit (AMBULATORY_SURGERY_CENTER): Admitting: Internal Medicine

## 2024-04-29 VITALS — BP 108/62 | HR 77 | Temp 97.4°F | Resp 13 | Ht 61.0 in | Wt 180.0 lb

## 2024-04-29 DIAGNOSIS — K648 Other hemorrhoids: Secondary | ICD-10-CM

## 2024-04-29 DIAGNOSIS — K635 Polyp of colon: Secondary | ICD-10-CM

## 2024-04-29 DIAGNOSIS — R195 Other fecal abnormalities: Secondary | ICD-10-CM

## 2024-04-29 DIAGNOSIS — D123 Benign neoplasm of transverse colon: Secondary | ICD-10-CM

## 2024-04-29 DIAGNOSIS — Z1211 Encounter for screening for malignant neoplasm of colon: Secondary | ICD-10-CM | POA: Diagnosis present

## 2024-04-29 DIAGNOSIS — D124 Benign neoplasm of descending colon: Secondary | ICD-10-CM

## 2024-04-29 MED ORDER — SODIUM CHLORIDE 0.9 % IV SOLN
500.0000 mL | Freq: Once | INTRAVENOUS | Status: DC
Start: 2024-04-29 — End: 2024-04-29

## 2024-04-29 NOTE — Progress Notes (Signed)
 Pt sedate, gd SR's, VSS, report to RN

## 2024-04-29 NOTE — Patient Instructions (Signed)

## 2024-04-29 NOTE — Op Note (Signed)
 [facility] Patient Name: [name] Procedure Date: 04/29/2024 7:33 AM MRN: [id number] Endoscopist: Rosario Estefana Kidney , , [id number] Age: 48 Referring MD:  Date of Birth: 26-Apr-1976 Gender: Female Account #: [id number] Procedure:                Colonoscopy Indications:              Positive Cologuard test Medicines:                Monitored Anesthesia Care Procedure:                Pre-Anesthesia Assessment:                           - Prior to the procedure, a History and Physical                            was performed, and patient medications and                            allergies were reviewed. The patient's tolerance of                            previous anesthesia was also reviewed. The risks                            and benefits of the procedure and the sedation                            options and risks were discussed with the patient.                            All questions were answered, and informed consent                            was obtained. Prior Anticoagulants: The patient has                            taken no anticoagulant or antiplatelet agents. ASA                            Grade Assessment: II - A patient with mild systemic                            disease. After reviewing the risks and benefits,                            the patient was deemed in satisfactory condition to                            undergo the procedure.                           After obtaining informed consent, the colonoscope  was passed under direct vision. Throughout the                            procedure, the patient's blood pressure, pulse, and                            oxygen saturations were monitored continuously. The                            CF [id number] #[id number] was introduced through the anus                            and advanced to the the cecum, identified by                            appendiceal orifice and  ileocecal valve. The                            colonoscopy was performed without difficulty. The                            patient tolerated the procedure well. The quality                            of the bowel preparation was good. The ileocecal                            valve, appendiceal orifice, and rectum were                            photographed. Scope In: 8:06:19 AM Scope Out: 8:40:42 AM Scope Withdrawal Time: 0 hours 30 minutes 16 seconds  Total Procedure Duration: 0 hours 34 minutes 23 seconds  Findings:                 An 18 mm polyp was found in the proximal transverse                            colon. The polyp was sessile. The polyp was removed                            with a cold snare. Resection and retrieval were                            complete. Area distal to and on the opposite wall                            of the polypectomy site was tattooed with an                            injection of Spot (carbon black).                           Three sessile polyps were  found in the descending                            colon and transverse colon. The polyps were 3 to 7                            mm in size. These polyps were removed with a cold                            snare. Resection and retrieval were complete.                           Non-bleeding internal hemorrhoids were found during                            retroflexion. Complications:            No immediate complications. Estimated Blood Loss:     Estimated blood loss was minimal. Impression:               - One 18 mm polyp in the proximal transverse colon,                            removed with a cold snare. Resected and retrieved.                            Tattooed.                           - Three 3 to 7 mm polyps in the descending colon                            and in the transverse colon, removed with a cold                            snare. Resected and retrieved.                            - Non-bleeding internal hemorrhoids. Recommendation:           - Discharge patient to home (with escort).                           - Await pathology results.                           - The findings and recommendations were discussed                            with the patient. Dr Estefana Federico Rosario Estefana Federico,  04/29/2024 8:50:13 AM

## 2024-04-29 NOTE — Progress Notes (Signed)
 Called to room to assist during endoscopic procedure.  Patient ID and intended procedure confirmed with present staff. Received instructions for my participation in the procedure from the performing physician.

## 2024-04-29 NOTE — Progress Notes (Signed)
 I have reviewed the patient's medical history in detail and updated the computerized patient record.

## 2024-04-29 NOTE — Progress Notes (Signed)
 GASTROENTEROLOGY PROCEDURE H&P NOTE   Primary Care Physician: Rexanne Ingle, MD    Reason for Procedure:   Positive Cologuard  Plan:    Colonoscopy  Patient is appropriate for endoscopic procedure(s) in the ambulatory ([facility]) setting.  The nature of the procedure, as well as the risks, benefits, and alternatives were carefully and thoroughly reviewed with the patient. Ample time for discussion and questions allowed. The patient understood, was satisfied, and agreed to proceed.     HPI: [name] is a 48 y.o. female who presents for colonoscopy for positive Cologaurd. Denies blood in stools, changes in bowel habits, or unintentional weight loss. Denies family history of colon cancer.  Past Medical History:  Diagnosis Date   Anemia    Anxiety    Depression    Diabetes mellitus without complication (HCC)    insulin    Fibroid    GERD (gastroesophageal reflux disease)    HPV in female    Hyperlipidemia    PID (acute pelvic inflammatory disease)    Trigger finger of left hand    LMF   Trigger finger of right hand    middle finger   Vaginal Pap smear, abnormal     Past Surgical History:  Procedure Laterality Date   carpel tunnel     COLPOSCOPY W/ BIOPSY / CURETTAGE     DILATION AND CURETTAGE OF UTERUS     laporoscopy     ROTATOR CUFF REPAIR     TRIGGER FINGER RELEASE Left 07/11/2017   Procedure: RELEASE TRIGGER FINGER/A-1 PULLEY LEFT MIDDLE;  Surgeon: Beverley Toribio FALCON, MD;  Location: [facility];  Service: Orthopedics;  Laterality: Left;   TRIGGER FINGER RELEASE Right 07/25/2017   Procedure: RELEASE TRIGGER FINGER, RIGHT MIDDLE FINGER;  Surgeon: Beverley Toribio FALCON, MD;  Location: [facility];  Service: Orthopedics;  Laterality: Right;   TUBAL LIGATION     TUBAL LIGATION Bilateral 10/08/2016   Procedure: LEFT PARTIAL SALPINGECTOMY;  Surgeon: Jon Rummer, MD;  Location: [facility];  Service: Gynecology;  Laterality: Bilateral;   tubal  reversal      Prior to Admission medications   Medication Sig Start Date End Date Taking? Authorizing Provider  Continuous Glucose Sensor (DEXCOM G7 SENSOR) MISC Apply topically every other day. 04/25/24  Yes [provider]  empagliflozin (JARDIANCE) 10 MG TABS tablet Take 10 mg by mouth daily.   Yes [provider]  gentamicin ointment (GARAMYCIN) 0.1 % Apply 1 Application topically. As needed 06/25/17  Yes [provider]  Insulin  Aspart (NOVOLOG  FLEXPEN [place]) Inject into the skin. Sliding scale   Yes [provider]  KETOCARE strip See admin instructions. 09/06/16  Yes [provider]  ONETOUCH VERIO test strip TEST BLOOD SUGAR 7 TIMES DAILY 09/12/16  Yes [provider]  rosuvastatin (CRESTOR) 5 MG tablet Take 5 mg by mouth daily.   Yes [provider]  spironolactone (ALDACTONE) 100 MG tablet Take 100 mg by mouth daily.   Yes [provider]  TRESIBA FLEXTOUCH 100 UNIT/ML SOPN FlexTouch Pen 40 Units 2 (two) times daily.  10/19/16  Yes [provider]  cyclobenzaprine  (FLEXERIL ) 10 MG tablet Take 1 tablet (10 mg total) by mouth 2 (two) times daily as needed for muscle spasms. 08/13/21   Waylan Elsie PARAS, PA-C  Dulaglutide (TRULICITY) 0.75 MG/0.5ML SOPN Inject into the skin. Patient not taking: Reported on 04/22/2024    [provider]  nystatin-triamcinolone (MYCOLOG II) cream Apply 1 Application topically. As  needed    [provider]  ondansetron  (ZOFRAN ) 4 MG tablet Take 1 tablet (4 mg total) by mouth every 8 (eight) hours as needed for nausea or vomiting. Patient not taking: Reported on 04/29/2024 07/11/17   Jule Ronal CROME, PA-C  oxyCODONE -acetaminophen  (ROXICET) 5-325 MG tablet Take 1 tablet every 4 (four) hours as needed by mouth. Patient not taking: Reported on 04/29/2024 07/25/17   Jule Ronal CROME, PA-C    Current Outpatient Medications  Medication Sig Dispense Refill   Continuous  Glucose Sensor (DEXCOM G7 SENSOR) MISC Apply topically every other day.     empagliflozin (JARDIANCE) 10 MG TABS tablet Take 10 mg by mouth daily.     gentamicin ointment (GARAMYCIN) 0.1 % Apply 1 Application topically. As needed     Insulin  Aspart (NOVOLOG  FLEXPEN [place]) Inject into the skin. Sliding scale     KETOCARE strip See admin instructions.  1   ONETOUCH VERIO test strip TEST BLOOD SUGAR 7 TIMES DAILY  11   rosuvastatin (CRESTOR) 5 MG tablet Take 5 mg by mouth daily.     spironolactone (ALDACTONE) 100 MG tablet Take 100 mg by mouth daily.     TRESIBA FLEXTOUCH 100 UNIT/ML SOPN FlexTouch Pen 40 Units 2 (two) times daily.      cyclobenzaprine  (FLEXERIL ) 10 MG tablet Take 1 tablet (10 mg total) by mouth 2 (two) times daily as needed for muscle spasms. 20 tablet 0   Dulaglutide (TRULICITY) 0.75 MG/0.5ML SOPN Inject into the skin. (Patient not taking: Reported on 04/22/2024)     nystatin-triamcinolone (MYCOLOG II) cream Apply 1 Application topically. As needed     ondansetron  (ZOFRAN ) 4 MG tablet Take 1 tablet (4 mg total) by mouth every 8 (eight) hours as needed for nausea or vomiting. (Patient not taking: Reported on 04/29/2024) 40 tablet 0   oxyCODONE -acetaminophen  (ROXICET) 5-325 MG tablet Take 1 tablet every 4 (four) hours as needed by mouth. (Patient not taking: Reported on 04/29/2024) 30 tablet 0   Current Facility-Administered Medications  Medication Dose Route Frequency Provider Last Rate Last Admin   0.9 %  sodium chloride  infusion  500 mL Intravenous Once Federico Rosario BROCKS, MD        Allergies as of 04/29/2024 - Review Complete 04/29/2024  Allergen Reaction Noted   Sulfa antibiotics Anaphylaxis 02/20/2016    Family History  Problem Relation Age of Onset   Hypertension Mother    Diabetes Mother    Hypertension Father    Diabetes Father    Diabetes Sister    Cancer Maternal Aunt    Diabetes Daughter    Kidney disease Daughter    Colon cancer Neg Hx    Esophageal cancer Neg Hx     Stomach cancer Neg Hx    Rectal cancer Neg Hx     Social History   Socioeconomic History   Marital status: Single    Spouse name: Not on file   Number of children: Not on file   Years of education: Not on file   Highest education level: Not on file  Occupational History   Not on file  Tobacco Use   Smoking status: Every Day    Current packs/day: 0.50    Types: Cigarettes   Smokeless tobacco: Never  Vaping Use   Vaping status: Never Used  Substance and Sexual Activity   Alcohol use: Yes    Comment: occassionally   Drug use: No   Sexual activity: Yes    Birth control/protection: Other-see comments, Surgical  Comment: tubal ligation - jan 2018  Other Topics Concern   Not on file  Social History Narrative   Not on file   Social Drivers of Health   Financial Resource Strain: Not on file  Food Insecurity: Not on file  Transportation Needs: Not on file  Physical Activity: Not on file  Stress: Not on file  Social Connections: Not on file  Intimate Partner Violence: Not on file    Physical Exam: Vital signs in last 24 hours: BP 121/78   Pulse 81   Temp (!) 97.4 F (36.3 C) (Temporal)   Ht 5' 1 (1.549 m)   Wt 180 lb (81.6 kg)   LMP 04/09/2024 (Exact Date)   SpO2 99%   Breastfeeding No   BMI 34.01 kg/m  GEN: NAD EYE: Sclerae anicteric ENT: MMM CV: Non-tachycardic Pulm: No increased work of breathing GI: Soft, NT/ND NEURO:  Alert & Oriented   Estefana Kidney, MD [facility]  04/29/2024 8:01 AM

## 2024-04-30 ENCOUNTER — Telehealth: Payer: Self-pay

## 2024-04-30 NOTE — Telephone Encounter (Signed)
 Left message on follow up call.

## 2024-05-01 ENCOUNTER — Ambulatory Visit: Payer: Self-pay | Admitting: Internal Medicine

## 2024-05-01 LAB — SURGICAL PATHOLOGY

## 2024-08-16 ENCOUNTER — Ambulatory Visit (HOSPITAL_COMMUNITY): Admission: EM | Admit: 2024-08-16 | Discharge: 2024-08-16 | Disposition: A | Discharge status: Home / Self Care

## 2024-08-16 ENCOUNTER — Other Ambulatory Visit: Payer: Self-pay

## 2024-08-16 ENCOUNTER — Encounter (HOSPITAL_COMMUNITY): Payer: Self-pay | Admitting: Emergency Medicine

## 2024-08-16 DIAGNOSIS — N611 Abscess of the breast and nipple: Secondary | ICD-10-CM | POA: Diagnosis not present

## 2024-08-16 DIAGNOSIS — Z872 Personal history of diseases of the skin and subcutaneous tissue: Secondary | ICD-10-CM

## 2024-08-16 MED ORDER — MUPIROCIN 2 % EX OINT: 1.0000 | TOPICAL_OINTMENT | Freq: Two times a day (BID) | CUTANEOUS | 0 refills | Status: AC

## 2024-08-16 MED ORDER — CEPHALEXIN 500 MG PO CAPS: 500.0000 mg | ORAL_CAPSULE | Freq: Four times a day (QID) | ORAL | 0 refills | Status: AC

## 2024-08-16 NOTE — ED Triage Notes (Signed)
 Abscess under left breast per patient.  Noticed 1 to 1 1/2 weeks ago.  Patient has had this issue before 2 days ago started taking doxycycline and pain medicine. Patient has the antibiotic from dermatologist for HS

## 2024-08-16 NOTE — ED Provider Notes (Signed)
 [facility]    CSN: [id number] Arrival date & time: 08/16/24  1017      History   Chief Complaint Chief Complaint  Patient presents with   Abscess    HPI [name] is a 48 y.o. female.   Patient presents today with a 1 week history of enlarging lesion on the inferior portion of her left breast.  She reports that a few days ago it started getting larger and more painful prompting evaluation.  Currently pain is rated 8 on a 0-10 pain scale, described as sharp, no alleviating factors identified.  She does have a history of hidradenitis suppurativa and is followed by dermatology for this condition but is not on immune modulating medication.  She does have a standing prescription for doxycycline when she has a flare and so started taking this about 2 days ago but has not had any improvement in symptoms.  She denies any associated breast pain, abnormal nipple discharge, fever, nausea, vomiting.  She is confident that she is not pregnant.  She has been applying warm compresses as well as using clindamycin topical gel without improvement of symptoms.    Past Medical History:  Diagnosis Date   Anemia    Anxiety    Depression    Diabetes mellitus without complication (HCC)    insulin    Fibroid    GERD (gastroesophageal reflux disease)    HPV in female    Hyperlipidemia    PID (acute pelvic inflammatory disease)    Trigger finger of left hand    LMF   Trigger finger of right hand    middle finger   Vaginal Pap smear, abnormal     Patient Active Problem List   Diagnosis Date Noted   Thrombocytopenic disorder 10/07/2016   Advanced maternal age in multigravida 10/07/2016   Mild preeclampsia 08/29/2016   Preeclampsia 08/24/2016   Pre-existing type 2 diabetes affecting pregnancy, antepartum 04/24/2016   History of reversal of tubal ligation 02/20/2016   H/O tubal ligation 02/20/2016   Diabetes mellitus (HCC) 02/20/2016   Vaginal bleeding 02/20/2016    Past  Surgical History:  Procedure Laterality Date   carpel tunnel     COLPOSCOPY W/ BIOPSY / CURETTAGE     DILATION AND CURETTAGE OF UTERUS     laporoscopy     ROTATOR CUFF REPAIR     TRIGGER FINGER RELEASE Left 07/11/2017   Procedure: RELEASE TRIGGER FINGER/A-1 PULLEY LEFT MIDDLE;  Surgeon: Beverley Toribio FALCON, MD;  Location: [facility];  Service: Orthopedics;  Laterality: Left;   TRIGGER FINGER RELEASE Right 07/25/2017   Procedure: RELEASE TRIGGER FINGER, RIGHT MIDDLE FINGER;  Surgeon: Beverley Toribio FALCON, MD;  Location: [facility];  Service: Orthopedics;  Laterality: Right;   TUBAL LIGATION     TUBAL LIGATION Bilateral 10/08/2016   Procedure: LEFT PARTIAL SALPINGECTOMY;  Surgeon: Jon Rummer, MD;  Location: [facility];  Service: Gynecology;  Laterality: Bilateral;   tubal reversal      OB History     Gravida  8   Para  4   Term  2   Preterm  2   AB  4   Living  4      SAB  2   IAB  0   Ectopic  1   Multiple  0   Live Births  1            Home Medications    Prior to Admission medications  Medication Sig Start Date End Date Taking? Authorizing Provider  cephALEXin  (KEFLEX ) 500 MG capsule Take 1 capsule (500 mg total) by mouth 4 (four) times daily. 08/16/24  Yes Raechal Raben K, PA-C  mupirocin  ointment (BACTROBAN ) 2 % Apply 1 Application topically 2 (two) times daily. 08/16/24  Yes Ezequias Lard K, PA-C  Continuous Glucose Sensor (DEXCOM G7 SENSOR) MISC Apply topically every other day. 04/25/24   [provider]  doxycycline (VIBRAMYCIN) 100 MG capsule     [provider]  Dulaglutide (TRULICITY) 0.75 MG/0.5ML SOPN Inject into the skin. Patient not taking: Reported on 04/22/2024    [provider]  empagliflozin (JARDIANCE) 10 MG TABS tablet Take 10 mg by mouth daily.    [provider]  gentamicin ointment (GARAMYCIN) 0.1 % Apply 1 Application topically. As needed 06/25/17   [provider]  Insulin   Aspart (NOVOLOG  FLEXPEN [place]) Inject into the skin. Sliding scale    [provider]  KETOCARE strip See admin instructions. 09/06/16   [provider]  nystatin-triamcinolone (MYCOLOG II) cream Apply 1 Application topically. As needed    [provider]  ondansetron  (ZOFRAN ) 4 MG tablet Take 1 tablet (4 mg total) by mouth every 8 (eight) hours as needed for nausea or vomiting. Patient not taking: Reported on 04/29/2024 07/11/17   Jule Ronal CROME, PA-C  [facility] VERIO test strip TEST BLOOD SUGAR 7 TIMES DAILY 09/12/16   [provider]  oxyCODONE -acetaminophen  (ROXICET) 5-325 MG tablet Take 1 tablet every 4 (four) hours as needed by mouth. Patient not taking: Reported on 04/29/2024 07/25/17   Jule Ronal CROME, PA-C  rosuvastatin (CRESTOR) 5 MG tablet Take 5 mg by mouth daily.    [provider]  spironolactone (ALDACTONE) 100 MG tablet Take 100 mg by mouth daily.    [provider]  TRESIBA FLEXTOUCH 100 UNIT/ML SOPN FlexTouch Pen 40 Units 2 (two) times daily.  10/19/16   [provider]    Family History Family History  Problem Relation Age of Onset   Hypertension Mother    Diabetes Mother    Hypertension Father    Diabetes Father    Diabetes Sister    Cancer Maternal Aunt    Diabetes Daughter    Kidney disease Daughter    Colon cancer Neg Hx    Esophageal cancer Neg Hx    Stomach cancer Neg Hx    Rectal cancer Neg Hx     Social History Social History   Tobacco Use   Smoking status: Every Day    Current packs/day: 0.50    Types: Cigarettes   Smokeless tobacco: Never  Vaping Use   Vaping status: Never Used  Substance Use Topics   Alcohol use: Yes    Comment: occassionally   Drug use: No     Allergies   Sulfa antibiotics   Review of Systems Review of Systems  Constitutional:  Positive for activity change. Negative for appetite change, fatigue and fever.  Respiratory:  Negative for shortness of breath.    Cardiovascular:  Negative for chest pain.  Gastrointestinal:  Negative for diarrhea, nausea and vomiting.  Skin:  Positive for color change and wound.     Physical Exam Triage Vital Signs ED Triage Vitals  Encounter Vitals Group     BP 08/16/24 1130 (!) 140/85     Girls Systolic BP Percentile --      Girls Diastolic BP Percentile --      Boys Systolic BP Percentile --  Boys Diastolic BP Percentile --      Pulse Rate 08/16/24 1130 87     Resp 08/16/24 1130 18     Temp 08/16/24 1130 99.1 F (37.3 C)     Temp Source 08/16/24 1130 Oral     SpO2 08/16/24 1130 98 %     Weight --      Height --      Head Circumference --      Peak Flow --      Pain Score 08/16/24 1126 8     Pain Loc --      Pain Education --      Exclude from Growth Chart --    No data found.  Updated Vital Signs BP (!) 143/84 (BP Location: Right Arm)   Pulse 87   Temp 99.1 F (37.3 C) (Oral)   Resp 18   LMP 08/09/2024 (Approximate)   SpO2 98%   Visual Acuity Right Eye Distance:   Left Eye Distance:   Bilateral Distance:    Right Eye Near:   Left Eye Near:    Bilateral Near:     Physical Exam Vitals reviewed.  Constitutional:      General: She is awake. She is not in acute distress.    Appearance: Normal appearance. She is well-developed. She is not ill-appearing.     Comments: Very pleasant female appears stated age in no acute distress sitting comfortable in exam room  HENT:     Head: Normocephalic and atraumatic.  Cardiovascular:     Rate and Rhythm: Normal rate and regular rhythm.     Heart sounds: Normal heart sounds, S1 normal and S2 normal. No murmur heard. Pulmonary:     Effort: Pulmonary effort is normal.     Breath sounds: Normal breath sounds. No wheezing, rhonchi or rales.     Comments: Clear to auscultation bilaterally Chest:     Chest wall: Mass and tenderness present. No deformity or swelling.  Breasts:    Right: Normal.     Left: Normal.     Comments: Left breast:  Approximately 4 cm x 2 cm erythematous nodule with induration without fluctuance noted at inframammary crease and mid axillary line.  No active bleeding or drainage noted. Abdominal:     Palpations: Abdomen is soft.     Tenderness: There is no abdominal tenderness.  Psychiatric:        Behavior: Behavior is cooperative.      UC Treatments / Results  Labs (all labs ordered are listed, but only abnormal results are displayed) Labs Reviewed - No data to display  EKG   Radiology No results found.  Procedures Procedures (including critical care time)  Medications Ordered in UC Medications - No data to display  Initial Impression / Assessment and Plan / UC Course  I have reviewed the triage vital signs and the nursing notes.  Pertinent labs & imaging results that were available during my care of the patient were reviewed by me and considered in my medical decision making (see chart for details).     Patient is well-appearing, afebrile, nontoxic, nontachycardic.  We did discuss potential utility of I&D, however, given lesion is close to breast with out significant fluctuance through shared decision making patient elected to defer I&D attempt.  She will continue doxycycline as previously prescribed but also add cephalexin 4 times daily for 5 days.  Recommend that she continue with warm compresses on this region and apply Bactroban ointment with dressing changes.  Ultrasound was ordered through breast center to investigate symptoms further and consider drainage of breast abscess.  We discussed that if she does not hear from scheduling department at [facility] imaging within a few days she is to contact us .  If anything worsens and she has enlarging lesion, increasing pain, fever, abnormal drainage, nausea/vomiting, breast changes she needs to be seen emergently.  Strict return precautions given.  She declined work excuse note.  Final Clinical Impressions(s) / UC Diagnoses   Final  diagnoses:  Left breast abscess  History of hidradenitis suppurativa     Discharge Instructions      Continue your doxycycline that was previously prescribed.  Start cephalexin 4 times daily for the next week.  Apply Bactroban ointment with dressing changes.  I have placed an order for an ultrasound.  They should call you first thing this coming week but if you do not hear from them within a few days please contact me so we can check on the status of your referral.  If anything changes and you have enlarging lesion, increasing pain, fever, abnormal discharge from the breast, nausea/vomiting you need to be seen immediately.  Please follow-up with your dermatologist as well.     ED Prescriptions     Medication Sig Dispense Auth. Provider   cephALEXin (KEFLEX) 500 MG capsule Take 1 capsule (500 mg total) by mouth 4 (four) times daily. 20 capsule Seaver Machia K, PA-C   mupirocin ointment (BACTROBAN) 2 % Apply 1 Application topically 2 (two) times daily. 22 g Anastasija Anfinson K, PA-C      PDMP not reviewed this encounter.   Sherrell Rocky POUR, PA-C 08/16/24 1216

## 2024-08-16 NOTE — Discharge Instructions (Signed)
 Continue your doxycycline that was previously prescribed.  Start cephalexin 4 times daily for the next week.  Apply Bactroban ointment with dressing changes.  I have placed an order for an ultrasound.  They should call you first thing this coming week but if you do not hear from them within a few days please contact me so we can check on the status of your referral.  If anything changes and you have enlarging lesion, increasing pain, fever, abnormal discharge from the breast, nausea/vomiting you need to be seen immediately.  Please follow-up with your dermatologist as well.
